# Patient Record
Sex: Female | Born: 1950
Health system: Southern US, Community
[De-identification: ages and names within clinical notes are randomized; demographics above are authoritative.]

## PROBLEM LIST (undated history)

## (undated) DIAGNOSIS — M8580 Other specified disorders of bone density and structure, unspecified site: Secondary | ICD-10-CM

## (undated) DIAGNOSIS — I10 Essential (primary) hypertension: Secondary | ICD-10-CM

## (undated) DIAGNOSIS — E785 Hyperlipidemia, unspecified: Secondary | ICD-10-CM

## (undated) DIAGNOSIS — M1990 Unspecified osteoarthritis, unspecified site: Secondary | ICD-10-CM

## (undated) DIAGNOSIS — T7840XA Allergy, unspecified, initial encounter: Secondary | ICD-10-CM

## (undated) DIAGNOSIS — D649 Anemia, unspecified: Secondary | ICD-10-CM

## (undated) HISTORY — DX: Other specified disorders of bone density and structure, unspecified site: M85.80

## (undated) HISTORY — DX: Anemia, unspecified: D64.9

## (undated) HISTORY — PX: POLYPECTOMY: SHX149

## (undated) HISTORY — DX: Allergy, unspecified, initial encounter: T78.40XA

## (undated) HISTORY — DX: Hyperlipidemia, unspecified: E78.5

## (undated) HISTORY — PX: ENDOMETRIAL ABLATION: SHX621

## (undated) HISTORY — PX: COLONOSCOPY: SHX174

## (undated) HISTORY — PX: OTHER SURGICAL HISTORY: SHX169

## (undated) HISTORY — PX: TONSILLECTOMY: SUR1361

## (undated) HISTORY — DX: Unspecified osteoarthritis, unspecified site: M19.90

---

## 2005-12-21 ENCOUNTER — Encounter: Admission: RE | Admit: 2005-12-21 | Discharge: 2005-12-21 | Payer: Self-pay | Admitting: Obstetrics & Gynecology

## 2011-12-26 ENCOUNTER — Other Ambulatory Visit: Payer: Self-pay | Admitting: Obstetrics and Gynecology

## 2011-12-26 ENCOUNTER — Other Ambulatory Visit: Payer: Self-pay | Admitting: Obstetrics & Gynecology

## 2011-12-26 DIAGNOSIS — Z780 Asymptomatic menopausal state: Secondary | ICD-10-CM

## 2011-12-26 DIAGNOSIS — Z1231 Encounter for screening mammogram for malignant neoplasm of breast: Secondary | ICD-10-CM

## 2012-01-12 ENCOUNTER — Ambulatory Visit
Admission: RE | Admit: 2012-01-12 | Discharge: 2012-01-12 | Disposition: A | Discharge status: Home / Self Care | Payer: BC Managed Care – PPO | Source: Ambulatory Visit | Attending: Obstetrics & Gynecology | Admitting: Obstetrics & Gynecology

## 2012-01-12 DIAGNOSIS — Z1231 Encounter for screening mammogram for malignant neoplasm of breast: Secondary | ICD-10-CM

## 2012-01-12 DIAGNOSIS — Z780 Asymptomatic menopausal state: Secondary | ICD-10-CM

## 2013-06-04 ENCOUNTER — Encounter (HOSPITAL_COMMUNITY): Payer: Self-pay | Admitting: Emergency Medicine

## 2013-06-04 ENCOUNTER — Emergency Department (HOSPITAL_COMMUNITY): Payer: BC Managed Care – PPO

## 2013-06-04 ENCOUNTER — Emergency Department (HOSPITAL_COMMUNITY)
Admission: EM | Admit: 2013-06-04 | Discharge: 2013-06-04 | Disposition: A | Discharge status: Home / Self Care | Payer: BC Managed Care – PPO | Attending: Emergency Medicine | Admitting: Emergency Medicine

## 2013-06-04 DIAGNOSIS — Z79899 Other long term (current) drug therapy: Secondary | ICD-10-CM | POA: Insufficient documentation

## 2013-06-04 DIAGNOSIS — K529 Noninfective gastroenteritis and colitis, unspecified: Secondary | ICD-10-CM

## 2013-06-04 DIAGNOSIS — Z7982 Long term (current) use of aspirin: Secondary | ICD-10-CM | POA: Insufficient documentation

## 2013-06-04 DIAGNOSIS — E876 Hypokalemia: Secondary | ICD-10-CM | POA: Insufficient documentation

## 2013-06-04 DIAGNOSIS — K921 Melena: Secondary | ICD-10-CM | POA: Insufficient documentation

## 2013-06-04 DIAGNOSIS — R197 Diarrhea, unspecified: Secondary | ICD-10-CM

## 2013-06-04 DIAGNOSIS — K5289 Other specified noninfective gastroenteritis and colitis: Secondary | ICD-10-CM | POA: Insufficient documentation

## 2013-06-04 DIAGNOSIS — I10 Essential (primary) hypertension: Secondary | ICD-10-CM | POA: Insufficient documentation

## 2013-06-04 DIAGNOSIS — R1110 Vomiting, unspecified: Secondary | ICD-10-CM

## 2013-06-04 HISTORY — DX: Essential (primary) hypertension: I10

## 2013-06-04 LAB — URINALYSIS, ROUTINE W REFLEX MICROSCOPIC
Bilirubin Urine: NEGATIVE
Glucose, UA: NEGATIVE mg/dL
Ketones, ur: NEGATIVE mg/dL
Leukocytes, UA: NEGATIVE
Nitrite: NEGATIVE
Protein, ur: NEGATIVE mg/dL
Specific Gravity, Urine: 1.016 (ref 1.005–1.030)
Urobilinogen, UA: 0.2 mg/dL (ref 0.0–1.0)
pH: 6.5 (ref 5.0–8.0)

## 2013-06-04 LAB — CBC WITH DIFFERENTIAL/PLATELET
Basophils Absolute: 0 10*3/uL (ref 0.0–0.1)
Basophils Relative: 0 % (ref 0–1)
Eosinophils Absolute: 0 10*3/uL (ref 0.0–0.7)
Eosinophils Relative: 0 % (ref 0–5)
HCT: 40.2 % (ref 36.0–46.0)
Hemoglobin: 14.1 g/dL (ref 12.0–15.0)
Lymphocytes Relative: 12 % (ref 12–46)
Lymphs Abs: 1.3 10*3/uL (ref 0.7–4.0)
MCH: 30.3 pg (ref 26.0–34.0)
MCHC: 35.1 g/dL (ref 30.0–36.0)
MCV: 86.5 fL (ref 78.0–100.0)
Monocytes Absolute: 0.4 10*3/uL (ref 0.1–1.0)
Monocytes Relative: 4 % (ref 3–12)
Neutro Abs: 8.8 10*3/uL — ABNORMAL HIGH (ref 1.7–7.7)
Neutrophils Relative %: 83 % — ABNORMAL HIGH (ref 43–77)
Platelets: 180 10*3/uL (ref 150–400)
RBC: 4.65 MIL/uL (ref 3.87–5.11)
RDW: 13.6 % (ref 11.5–15.5)
WBC: 10.5 10*3/uL (ref 4.0–10.5)

## 2013-06-04 LAB — COMPREHENSIVE METABOLIC PANEL
ALT: 14 U/L (ref 0–35)
Albumin: 4.2 g/dL (ref 3.5–5.2)
Alkaline Phosphatase: 72 U/L (ref 39–117)
BUN: 15 mg/dL (ref 6–23)
CO2: 23 mEq/L (ref 19–32)
Calcium: 9.6 mg/dL (ref 8.4–10.5)
Chloride: 98 mEq/L (ref 96–112)
Creatinine, Ser: 0.52 mg/dL (ref 0.50–1.10)
GFR calc non Af Amer: >90 mL/min (ref >90)
Glucose, Bld: 145 mg/dL — ABNORMAL HIGH (ref 70–99)
Sodium: 137 mEq/L (ref 135–145)
Total Bilirubin: 0.8 mg/dL (ref 0.3–1.2)
Total Protein: 7.1 g/dL (ref 6.0–8.3)

## 2013-06-04 LAB — URINE MICROSCOPIC-ADD ON

## 2013-06-04 LAB — OCCULT BLOOD, POC DEVICE: Fecal Occult Bld: POSITIVE — AB

## 2013-06-04 MED ORDER — IOHEXOL 300 MG/ML  SOLN
100.0000 mL | Freq: Once | INTRAMUSCULAR | Status: AC | PRN
  Administered 2013-06-04: 100 mL via INTRAVENOUS

## 2013-06-04 MED ORDER — CIPROFLOXACIN HCL 500 MG PO TABS
500.0000 mg | ORAL_TABLET | Freq: Once | ORAL | Status: AC
  Administered 2013-06-04: 500 mg via ORAL
  Filled 2013-06-04: qty 1

## 2013-06-04 MED ORDER — IOHEXOL 300 MG/ML  SOLN
25.0000 mL | INTRAMUSCULAR | Status: AC
  Administered 2013-06-04: 25 mL via ORAL

## 2013-06-04 MED ORDER — SODIUM CHLORIDE 0.9 % IV BOLUS (SEPSIS)
1000.0000 mL | Freq: Once | INTRAVENOUS | Status: AC
  Administered 2013-06-04: 1000 mL via INTRAVENOUS

## 2013-06-04 MED ORDER — POTASSIUM CHLORIDE CRYS ER 20 MEQ PO TBCR
40.0000 meq | EXTENDED_RELEASE_TABLET | Freq: Once | ORAL | Status: AC
  Administered 2013-06-04: 40 meq via ORAL
  Filled 2013-06-04: qty 2

## 2013-06-04 MED ORDER — METRONIDAZOLE 500 MG PO TABS
500.0000 mg | ORAL_TABLET | Freq: Once | ORAL | Status: AC
  Administered 2013-06-04: 500 mg via ORAL
  Filled 2013-06-04: qty 1

## 2013-06-04 MED ORDER — HYDROCODONE-ACETAMINOPHEN 5-325 MG PO TABS: 1.0000 | ORAL_TABLET | ORAL | Status: DC | PRN

## 2013-06-04 MED ORDER — MORPHINE SULFATE 4 MG/ML IJ SOLN
6.0000 mg | Freq: Once | INTRAMUSCULAR | Status: DC
  Filled 2013-06-04: qty 2

## 2013-06-04 MED ORDER — CIPROFLOXACIN HCL 500 MG PO TABS: 500.0000 mg | ORAL_TABLET | Freq: Two times a day (BID) | ORAL | Status: DC

## 2013-06-04 MED ORDER — METRONIDAZOLE 500 MG PO TABS: 500.0000 mg | ORAL_TABLET | Freq: Three times a day (TID) | ORAL | Status: DC

## 2013-06-04 MED ORDER — ONDANSETRON 4 MG PO TBDP: ORAL_TABLET | ORAL | Status: DC

## 2013-06-04 NOTE — ED Provider Notes (Signed)
CSN: [id number]     Arrival date & time 06/04/13  0022 History   First MD Initiated Contact with Patient 06/04/13 0211     Chief Complaint  Patient presents with  . Abdominal Pain  . Diarrhea  . Emesis  . Hematochezia   (Consider location/radiation/quality/duration/timing/severity/associated sxs/prior Treatment) HPI Comments: 62 yo female with htn hx, C section hx presents with LLQ pain and lighter blood in stools this evening, pain started this evening, no hx of similar, left lower region, severe cramps, intermittent, with diarrhea.  No diverticular hx.  Polyp removed on past colonscopy.  No ulcer hx.    Patient is a 62 y.o. female presenting with abdominal pain, diarrhea, and vomiting.  Abdominal Pain Associated symptoms: diarrhea and vomiting   Associated symptoms: no chest pain, no chills, no dysuria, no fever and no shortness of breath   Diarrhea Associated symptoms: abdominal pain and vomiting   Associated symptoms: no chills, no fever and no headaches   Emesis Associated symptoms: abdominal pain and diarrhea   Associated symptoms: no chills and no headaches     Past Medical History  Diagnosis Date  . Hypertension    Past Surgical History  Procedure Laterality Date  . Cesarean section     No family history on file. History  Substance Use Topics  . Smoking status: Never Smoker   . Smokeless tobacco: Not on file  . Alcohol Use: No   OB History   Grav Para Term Preterm Abortions TAB SAB Ect Mult Living                 Review of Systems  Constitutional: Negative for fever and chills.  HENT: Negative for congestion.   Eyes: Negative for visual disturbance.  Respiratory: Negative for shortness of breath.   Cardiovascular: Negative for chest pain.  Gastrointestinal: Positive for vomiting, abdominal pain, diarrhea and blood in stool.  Genitourinary: Negative for dysuria and flank pain.  Musculoskeletal: Negative for back pain, neck pain and neck stiffness.  Skin:  Negative for rash.  Neurological: Negative for light-headedness and headaches.    Allergies  Review of patient's allergies indicates no known allergies.  Home Medications   Current Outpatient Rx  Name  Route  Sig  Dispense  Refill  . amLODipine (NORVASC) 5 MG tablet   Oral   Take 5 mg by mouth daily.         Marland Kitchen aspirin EC 81 MG tablet   Oral   Take 81 mg by mouth daily.         . Calcium Carbonate-Vitamin D (CALCIUM-D) 600-400 MG-UNIT TABS   Oral   Take 1 tablet by mouth every morning.         . Cholecalciferol (VITAMIN D3) 2000 UNITS TABS   Oral   Take 1 tablet by mouth daily.         . Coenzyme Q10 (COQ10) 200 MG CAPS   Oral   Take 1 capsule by mouth every morning.         . Glucosamine-Chondroit-Vit C-Mn (GLUCOSAMINE 1500 COMPLEX PO)   Oral   Take 1 tablet by mouth every morning.         Boris Lown Oil 300 MG CAPS   Oral   Take 1 capsule by mouth daily.         . Multiple Vitamin (MULTIVITAMIN WITH MINERALS) TABS tablet   Oral   Take 1 tablet by mouth daily.  BP 160/74  Pulse 65  Temp(Src) 98.1 F (36.7 C) (Oral)  Resp 18  Wt 154 lb 9 oz (70.109 kg)  SpO2 94% Physical Exam  Nursing note and vitals reviewed. Constitutional: She is oriented to person, place, and time. She appears well-developed and well-nourished.  HENT:  Head: Normocephalic and atraumatic.  Eyes: Conjunctivae are normal. Right eye exhibits no discharge. Left eye exhibits no discharge.  Neck: Normal range of motion. Neck supple. No tracheal deviation present.  Cardiovascular: Normal rate and regular rhythm.   Pulmonary/Chest: Effort normal and breath sounds normal.  Abdominal: Soft. She exhibits no distension. There is tenderness (LLQ and suprapubic). There is no guarding.  Genitourinary:  Gross blood lighter red, no hemorrhoids seen  Musculoskeletal: She exhibits no edema.  Neurological: She is alert and oriented to person, place, and time.  Skin: Skin is warm. No  rash noted.  Psychiatric: She has a normal mood and affect.    ED Course  Procedures (including critical care time) Labs Review Labs Reviewed  CBC WITH DIFFERENTIAL - Abnormal; Notable for the following:    Neutrophils Relative % 83 (*)    Neutro Abs 8.8 (*)    All other components within normal limits  COMPREHENSIVE METABOLIC PANEL - Abnormal; Notable for the following:    Potassium 3.3 (*)    Glucose, Bld 145 (*)    All other components within normal limits  URINALYSIS, ROUTINE W REFLEX MICROSCOPIC - Abnormal; Notable for the following:    Hgb urine dipstick SMALL (*)    All other components within normal limits  URINE MICROSCOPIC-ADD ON - Abnormal; Notable for the following:    Squamous Epithelial / LPF FEW (*)    Bacteria, UA FEW (*)    Casts HYALINE CASTS (*)    All other components within normal limits  OCCULT BLOOD, POC DEVICE - Abnormal; Notable for the following:    Fecal Occult Bld POSITIVE (*)    All other components within normal limits  LIPASE, BLOOD   Imaging Review Ct Abdomen Pelvis W Contrast  06/04/2013   CLINICAL DATA:  Left lower quadrant abdominal pain, nausea, vomiting and diarrhea. Chills.  EXAM: CT ABDOMEN AND PELVIS WITH CONTRAST  TECHNIQUE: Multidetector CT imaging of the abdomen and pelvis was performed using the standard protocol following bolus administration of intravenous contrast.  CONTRAST:  OMNIPAQUE IOHEXOL 300 MG/ML  SOLN  COMPARISON:  None.  FINDINGS: The visualized lung bases are clear.  The liver and spleen are unremarkable in appearance. The gallbladder is within normal limits. The pancreas and adrenal glands are unremarkable.  The kidneys are unremarkable in appearance. There is no evidence of hydronephrosis. No renal or ureteral stones are seen. No perinephric stranding is appreciated.  No free fluid is identified. The small bowel is unremarkable in appearance. The stomach is within normal limits. No acute vascular abnormalities are seen.   The appendix is not definitely seen; there is no evidence for appendicitis. Mild scattered diverticulosis is noted at the hepatic flexure of the colon, and to a lesser extent along the transverse colon.  There appears to be mild diffuse wall thickening along the descending and proximal sigmoid colon, with mild associated soft tissue stranding. This raises concern for very mild colitis. Minimal diverticulosis is noted along the mid sigmoid colon.  The bladder is mildly distended and grossly unremarkable. The uterus is grossly normal in appearance, with scattered calcification likely reflecting a fibroid. No inguinal lymphadenopathy is seen.  No acute osseous abnormalities  are identified. Vacuum phenomenon and disc space narrowing are noted at L5-S1, with associated sclerotic change.  IMPRESSION: 1. Apparent mild acute colitis involving the descending and proximal sigmoid colon, with mild diffuse wall thickening and trace soft tissue inflammation. 2. Mild scattered diverticulosis at the hepatic flexure of the colon and minimally along the transverse and mid sigmoid colon.   Electronically Signed   By: Roanna Raider M.D.   On: 06/04/2013 04:53    EKG Interpretation   None       MDM   1. Colitis   2. Vomiting   3. Diarrhea   4. Blood in stool   HypoK  Concern for diverticulitis, no hx of similar. CT scan and blood work. Pain meds and fluids.   Pt refused pain meds. CT showed colitis.  PO abx in ED. Discussed diet/ abx and close fup oupt/ reasons to return.  Results and differential diagnosis were discussed with the patient. Close follow up outpatient was discussed, patient comfortable with the plan.   Diagnosis: above    Enid Skeens, MD 06/04/13 [phone]

## 2013-06-04 NOTE — ED Notes (Signed)
Pt. reports left lower abdominal pain with nausea , vomitting and diarrhea / chills. Pt. noted blood on her diarrhea.

## 2013-06-04 NOTE — ED Notes (Signed)
Pt c/o clots in her diarrhea starting this afternoon, states blood is red, pt states shes thrown up 4-5 times. LLQ abdominal pain with mid epigastric pain.

## 2013-06-27 ENCOUNTER — Encounter: Payer: Self-pay | Admitting: Internal Medicine

## 2013-07-23 ENCOUNTER — Encounter: Payer: Self-pay | Admitting: Internal Medicine

## 2013-07-24 ENCOUNTER — Encounter: Payer: Self-pay | Admitting: Internal Medicine

## 2013-07-24 ENCOUNTER — Other Ambulatory Visit: Payer: Self-pay | Admitting: Gastroenterology

## 2013-07-24 ENCOUNTER — Ambulatory Visit (INDEPENDENT_AMBULATORY_CARE_PROVIDER_SITE_OTHER): Payer: BC Managed Care – PPO | Admitting: Internal Medicine

## 2013-07-24 ENCOUNTER — Telehealth: Payer: Self-pay | Admitting: Gastroenterology

## 2013-07-24 VITALS — BP 140/72 | HR 68 | Wt 151.2 lb

## 2013-07-24 DIAGNOSIS — R109 Unspecified abdominal pain: Secondary | ICD-10-CM

## 2013-07-24 DIAGNOSIS — I10 Essential (primary) hypertension: Secondary | ICD-10-CM | POA: Insufficient documentation

## 2013-07-24 DIAGNOSIS — R9389 Abnormal findings on diagnostic imaging of other specified body structures: Secondary | ICD-10-CM

## 2013-07-24 DIAGNOSIS — K625 Hemorrhage of anus and rectum: Secondary | ICD-10-CM

## 2013-07-24 MED ORDER — MOVIPREP 100 G PO SOLR: ORAL | Status: DC

## 2013-07-24 MED ORDER — HYOSCYAMINE SULFATE 0.125 MG SL SUBL: 0.1250 mg | SUBLINGUAL_TABLET | SUBLINGUAL | Status: DC | PRN

## 2013-07-24 NOTE — Patient Instructions (Signed)
You have been scheduled for a colonoscopy with propofol. Please follow written instructions given to you at your visit today.  Please pick up your prep kit at the pharmacy within the next 1-3 days. If you use inhalers (even only as needed), please bring them with you on the day of your procedure. Your physician has requested that you go to www.[email or web address].com and enter the access code given to you at your visit today. This web site gives a general overview about your procedure. However, you should still follow specific instructions given to you by our office regarding your preparation for the procedure.                                                We are excited to introduce MyChart, a new best-in-class service that provides you online access to important information in your electronic medical record. We want to make it easier for you to view your health information - all in one secure location - when and where you need it. We expect MyChart will enhance the quality of care and service we provide.  When you register for MyChart, you can:    View your test results.    Request appointments and receive appointment reminders via email.    Request medication renewals.    View your medical history, allergies, medications and immunizations.    Communicate with your physician's office through a password-protected site.    Conveniently print information such as your medication lists.  To find out if MyChart is right for you, please talk to a member of our clinical staff today. We will gladly answer your questions about this free health and wellness tool.  If you are age 18 or older and want a member of your family to have access to your record, you must provide written consent by completing a proxy form available at our office. Please speak to our clinical staff about guidelines regarding accounts for patients younger than age 18.  As you activate your MyChart account and need any technical  assistance, please call the MyChart technical support line at [phone] ([phone]) or email your question to [email or web address]. If you email your question(s), please include your name, a return phone number and the best time to reach you.  If you have non-urgent health-related questions, you can send a message to our office through MyChart at [email or web address]. If you have a medical emergency, call 911.  Thank you for using MyChart as your new health and wellness resource!   MyChart licensed from [employer],  1999-2010. Patents Pending.   

## 2013-07-24 NOTE — Telephone Encounter (Signed)
Sent in levsin per Dr. Hilarie Fredrickson

## 2013-07-24 NOTE — Telephone Encounter (Signed)
Message copied by Annabell Sabal on Wed Jul 24, 2013  1:25 PM ------      Message from: Jerene Bears      Created: Wed Jul 24, 2013  1:07 PM       Levsin 0.25 mg SL every 4 hours when necessary left-sided abdominal discomfort ------

## 2013-07-24 NOTE — Progress Notes (Signed)
Patient ID: Karen Rangel, female   DOB: 06/17/1950, 63 y.o.   MRN: [id number] HPI: Mrs. Dorer is a 63-year-old female with a past medical history of hypertension, colonic polyps who is seen in consultation at the request of Dr. Rankins to evaluate left-sided abdominal pain, rectal bleeding and abnormal GI imaging. She is here today alone.  She reports in mid December developing 2-3 weeks of left-sided abdominal discomfort. She describes this as a burning type pain which was particularly more noticeable at night. It became acutely worse around 06/04/2013 when she developed waves of more severe pain associated with vomiting and diarrhea. The diarrhea became bloody and she was seen in the ER. CT scan revealed left-sided colitis and she was treated with 10 days of ciprofloxacin and metronidazole. She completed this antibiotic therapy and her rectal bleeding and pain have improved. She is still having some mild burning discomfort in her left abdomen which seems to come and go. She is having 2-3 bowel movements per day which are soft and formed. No further rectal bleeding. No melena. Prior to these episodes occurred normal bowel movements occurred every 2-3 days. She reports a good appetite without nausea or vomiting. No fever. No weight loss. No new medications. No joint symptoms, eye complaints, rashes or oral ulcers.  She's had 2 previous colonoscopies performed for screening. These were done in [place]. Her first one she recalls several benign polyps but her second and last colonoscopy was performed in 2010 which was reportedly normal. The family history of IBD or GI malignancy. Her mother had IBS.  Past Medical History  Diagnosis Date  . Hypertension     Past Surgical History  Procedure Laterality Date  . Cesarean section      Current Outpatient Prescriptions  Medication Sig Dispense Refill  . amLODipine (NORVASC) 5 MG tablet Take 5 mg by mouth daily.      . aspirin EC 81 MG tablet Take 81 mg by  mouth daily.      . Calcium Carbonate-Vitamin D (CALCIUM-D) 600-400 MG-UNIT TABS Take 1 tablet by mouth every morning.      . Cholecalciferol (VITAMIN D3) 2000 UNITS TABS Take 1 tablet by mouth daily.      . Coenzyme Q10 (COQ10) 200 MG CAPS Take 1 capsule by mouth every morning.      . Glucosamine-Chondroit-Vit C-Mn (GLUCOSAMINE 1500 COMPLEX PO) Take 1 tablet by mouth every morning.      . Krill Oil 300 MG CAPS Take 1 capsule by mouth daily.      . MOVIPREP 100 G SOLR Use per prep instruction  1 kit  0  . Multiple Vitamin (MULTIVITAMIN WITH MINERALS) TABS tablet Take 1 tablet by mouth daily.       No current facility-administered medications for this visit.    No Known Allergies  No family history on file.  History  Substance Use Topics  . Smoking status: Never Smoker   . Smokeless tobacco: Never Used  . Alcohol Use: No    ROS: As per history of present illness, otherwise negative  BP 140/72  Pulse 68  Wt 151 lb 3.2 oz (68.584 kg) Constitutional: Well-developed and well-nourished. No distress. HEENT: Normocephalic and atraumatic. Oropharynx is clear and moist. No oropharyngeal exudate. Conjunctivae are normal.  No scleral icterus. Neck: Neck supple. Trachea midline. Cardiovascular: Normal rate, regular rhythm and intact distal pulses. No M/R/G Pulmonary/chest: Effort normal and breath sounds normal. No wheezing, rales or rhonchi. Abdominal: Soft, mild left-sided tenderness, no   rebound or guarding, nondistended. Bowel sounds active throughout.  Extremities: no clubbing, cyanosis, or edema Lymphadenopathy: No cervical adenopathy noted. Neurological: Alert and oriented to person place and time. Skin: Skin is warm and dry. No rashes noted. Psychiatric: Normal mood and affect. Behavior is normal.  RELEVANT LABS AND IMAGING: CBC    Component Value Date/Time   WBC 10.5 06/04/2013 0045   RBC 4.65 06/04/2013 0045   HGB 14.1 06/04/2013 0045   HCT 40.2 06/04/2013 0045   PLT 180  06/04/2013 0045   MCV 86.5 06/04/2013 0045   MCH 30.3 06/04/2013 0045   MCHC 35.1 06/04/2013 0045   RDW 13.6 06/04/2013 0045   LYMPHSABS 1.3 06/04/2013 0045   MONOABS 0.4 06/04/2013 0045   EOSABS 0.0 06/04/2013 0045   BASOSABS 0.0 06/04/2013 0045    CMP     Component Value Date/Time   NA 137 06/04/2013 0045   K 3.3* 06/04/2013 0045   CL 98 06/04/2013 0045   CO2 23 06/04/2013 0045   GLUCOSE 145* 06/04/2013 0045   BUN 15 06/04/2013 0045   CREATININE 0.52 06/04/2013 0045   CALCIUM 9.6 06/04/2013 0045   PROT 7.1 06/04/2013 0045   ALBUMIN 4.2 06/04/2013 0045   AST 18 06/04/2013 0045   ALT 14 06/04/2013 0045   ALKPHOS 72 06/04/2013 0045   BILITOT 0.8 06/04/2013 0045   GFRNONAA >90 06/04/2013 0045   GFRAA >90 06/04/2013 0045   Lipase     Component Value Date/Time   LIPASE 19 06/04/2013 0045   CT ABDOMEN AND PELVIS WITH CONTRAST   TECHNIQUE: Multidetector CT imaging of the abdomen and pelvis was performed using the standard protocol following bolus administration of intravenous contrast.   CONTRAST:  100mL OMNIPAQUE IOHEXOL 300 MG/ML  SOLN   COMPARISON:  None.   FINDINGS: The visualized lung bases are clear.   The liver and spleen are unremarkable in appearance. The gallbladder is within normal limits. The pancreas and adrenal glands are unremarkable.   The kidneys are unremarkable in appearance. There is no evidence of hydronephrosis. No renal or ureteral stones are seen. No perinephric stranding is appreciated.   No free fluid is identified. The small bowel is unremarkable in appearance. The stomach is within normal limits. No acute vascular abnormalities are seen.   The appendix is not definitely seen; there is no evidence for appendicitis. Mild scattered diverticulosis is noted at the hepatic flexure of the colon, and to a lesser extent along the transverse colon.   There appears to be mild diffuse wall thickening along the descending and proximal  sigmoid colon, with mild associated soft tissue stranding. This raises concern for very mild colitis. Minimal diverticulosis is noted along the mid sigmoid colon.   The bladder is mildly distended and grossly unremarkable. The uterus is grossly normal in appearance, with scattered calcification likely reflecting a fibroid. No inguinal lymphadenopathy is seen.   No acute osseous abnormalities are identified. Vacuum phenomenon and disc space narrowing are noted at L5-S1, with associated sclerotic change.   IMPRESSION: 1. Apparent mild acute colitis involving the descending and proximal sigmoid colon, with mild diffuse wall thickening and trace soft tissue inflammation. 2. Mild scattered diverticulosis at the hepatic flexure of the colon and minimally along the transverse and mid sigmoid colon.    ASSESSMENT/PLAN: 62-year-old female with a past medical history of hypertension, colonic polyps who is seen in consultation at the request of Dr. Rankins to evaluate left-sided abdominal pain, rectal bleeding and abnormal GI imaging    1.  Abnl GI imaging/left-sided abd pain/rectal bleeding -- I have recommended repeating colonoscopy to evaluate for left-sided colitis. The differential includes inflammatory bowel disease, ischemic colitis and less likely infectious colitis. Her symptoms did not start abruptly which also argues against ischemic insult. Any infectious process should have been treated adequately with ciprofloxacin and metronidazole. I will give her prescription for Levsin to be used as needed and as directed for left-sided abdominal pain. The pain worsens I have asked that she notify me immediately. She voices understanding. Further recommendations can be made after colonoscopic findings.

## 2013-07-29 ENCOUNTER — Encounter: Payer: Self-pay | Admitting: Internal Medicine

## 2013-07-31 ENCOUNTER — Encounter: Payer: Self-pay | Admitting: Internal Medicine

## 2013-07-31 ENCOUNTER — Ambulatory Visit (AMBULATORY_SURGERY_CENTER): Payer: BC Managed Care – PPO | Admitting: Internal Medicine

## 2013-07-31 VITALS — BP 142/81 | HR 58 | Temp 96.9°F | Resp 20 | Ht 62.0 in | Wt 151.0 lb

## 2013-07-31 DIAGNOSIS — R933 Abnormal findings on diagnostic imaging of other parts of digestive tract: Secondary | ICD-10-CM

## 2013-07-31 DIAGNOSIS — R109 Unspecified abdominal pain: Secondary | ICD-10-CM

## 2013-07-31 DIAGNOSIS — D126 Benign neoplasm of colon, unspecified: Secondary | ICD-10-CM

## 2013-07-31 MED ORDER — SODIUM CHLORIDE 0.9 % IV SOLN: 500.0000 mL | INTRAVENOUS | Status: DC

## 2013-07-31 NOTE — Op Note (Signed)
[facility]  [address] [place], [id number]   COLONOSCOPY PROCEDURE REPORT  PATIENT: Karen Rangel, Karen Rangel  MR#: [id number] BIRTHDATE: 1950/11/22 , 58  yrs. old GENDER: Female ENDOSCOPIST: Jerene Bears, MD REFERRED QI:[id number] Rankins, M.D. PROCEDURE DATE:  07/31/2013 PROCEDURE:   Colonoscopy with cold biopsy polypectomy First Screening Colonoscopy - Avg.  risk and is 50 yrs.  old or older - No.  Prior Negative Screening - Now for repeat screening. N/A  History of Adenoma - Now for follow-up colonoscopy & has been > or = to 3 yrs.  N/A  Polyps Removed Today? Yes. ASA CLASS:   Class II INDICATIONS:abdominal pain in the lower left quadrant and an abnormal CT. MEDICATIONS: MAC sedation, administered by CRNA and Propofol (Diprivan) 370 mg IV  DESCRIPTION OF PROCEDURE:   After the risks benefits and alternatives of the procedure were thoroughly explained, informed consent was obtained.  A digital rectal exam revealed no rectal mass.   The LB [id number]  endoscope was introduced through the anus and advanced to the terminal ileum which was intubated for a short distance. No adverse events experienced.   The quality of the prep was good, using MoviPrep  The instrument was then slowly withdrawn as the colon was fully examined.   COLON FINDINGS: The mucosa appeared normal in the terminal ileum. Mild diverticulosis was noted in the ascending colon, at the hepatic flexure, and sigmoid colon.   The colon mucosa was otherwise normal.  No inflammation was seen.  Retroflexed views revealed skin tag and no other  abnormalities. The time to cecum=7 minutes 33 seconds.  Withdrawal time=11 minutes 50 seconds.  The scope was withdrawn and the procedure completed. COMPLICATIONS: There were no complications.  ENDOSCOPIC IMPRESSION: 1.   Normal mucosa in the terminal ileum 2.   Mild diverticulosis was noted in the ascending colon and at the hepatic flexure 3.   The colon mucosa  was otherwise normal without evidence of colitis/inflammation  RECOMMENDATIONS: 1.  Await pathology results 2.  High fiber diet 3.  If the polyp removed today is proven to be an adenomatous (pre-cancerous) polyp, you will need a repeat colonoscopy in 5 years.  Otherwise you should continue to follow colorectal cancer screening guidelines for "routine risk" patients with colonoscopy in 10 years.  You will receive a letter within 1-2 weeks with the results of your biopsy as well as final recommendations.  Please call my office if you have not received a letter after 3 weeks. 4.   One month of probiotic   eSigned:  Jerene Bears, MD 07/31/2013 10:11 AM    cc: The Patient and Milagros Evener, MD

## 2013-07-31 NOTE — Progress Notes (Signed)
Report to pacu rn, vss, bbs=clear 

## 2013-07-31 NOTE — Progress Notes (Signed)
Called to room to assist during endoscopic procedure.  Patient ID and intended procedure confirmed with present staff. Received instructions for my participation in the procedure from the performing physician.  

## 2013-07-31 NOTE — Progress Notes (Signed)
Patient denies any allergies to eggs or soy. Patient denies any problems with anesthesia.  

## 2013-07-31 NOTE — Patient Instructions (Signed)
Discharge instructions given with verbal understanding. Handouts on polyps and diverticulosis. Resume previous medications. YOU HAD AN ENDOSCOPIC PROCEDURE TODAY AT THE [facility]: Refer to the procedure report that was given to you for any specific questions about what was found during the examination.  If the procedure report does not answer your questions, please call your gastroenterologist to clarify.  If you requested that your care partner not be given the details of your procedure findings, then the procedure report has been included in a sealed envelope for you to review at your convenience later.  YOU SHOULD EXPECT: Some feelings of bloating in the abdomen. Passage of more gas than usual.  Walking can help get rid of the air that was put into your GI tract during the procedure and reduce the bloating. If you had a lower endoscopy (such as a colonoscopy or flexible sigmoidoscopy) you may notice spotting of blood in your stool or on the toilet paper. If you underwent a bowel prep for your procedure, then you may not have a normal bowel movement for a few days.  DIET: Your first meal following the procedure should be a light meal and then it is ok to progress to your normal diet.  A half-sandwich or bowl of soup is an example of a good first meal.  Heavy or fried foods are harder to digest and may make you feel nauseous or bloated.  Likewise meals heavy in dairy and vegetables can cause extra gas to form and this can also increase the bloating.  Drink plenty of fluids but you should avoid alcoholic beverages for 24 hours.  ACTIVITY: Your care partner should take you home directly after the procedure.  You should plan to take it easy, moving slowly for the rest of the day.  You can resume normal activity the day after the procedure however you should NOT DRIVE or use heavy machinery for 24 hours (because of the sedation medicines used during the test).    SYMPTOMS TO REPORT  IMMEDIATELY: A gastroenterologist can be reached at any hour.  During normal business hours, 8:30 AM to 5:00 PM Monday through Friday, call [phone].  After hours and on weekends, please call the GI answering service at [phone] who will take a message and have the physician on call contact you.   Following lower endoscopy (colonoscopy or flexible sigmoidoscopy):  Excessive amounts of blood in the stool  Significant tenderness or worsening of abdominal pains  Swelling of the abdomen that is new, acute  Fever of 100F or higher  FOLLOW UP: If any biopsies were taken you will be contacted by phone or by letter within the next 1-3 weeks.  Call your gastroenterologist if you have not heard about the biopsies in 3 weeks.  Our staff will call the home number listed on your records the next business day following your procedure to check on you and address any questions or concerns that you may have at that time regarding the information given to you following your procedure. This is a courtesy call and so if there is no answer at the home number and we have not heard from you through the emergency physician on call, we will assume that you have returned to your regular daily activities without incident.  SIGNATURES/CONFIDENTIALITY: You and/or your care partner have signed paperwork which will be entered into your electronic medical record.  These signatures attest to the fact that that the information above on your After Visit Summary   has been reviewed and is understood.  Full responsibility of the confidentiality of this discharge information lies with you and/or your care-partner. 

## 2013-08-01 ENCOUNTER — Telehealth: Payer: Self-pay

## 2013-08-01 NOTE — Telephone Encounter (Signed)
  Follow up Call-  Call back number 07/31/2013  Post procedure Call Back phone  # [phone]  Permission to leave phone message Yes     Patient questions:  Do you have a fever, pain , or abdominal swelling? no Pain Score  0 *  Have you tolerated food without any problems? yes  Have you been able to return to your normal activities? yes  Do you have any questions about your discharge instructions: Diet   no Medications  no Follow up visit  no  Do you have questions or concerns about your Care? no  Actions: * If pain score is 4 or above: No action needed, pain <4.

## 2013-08-07 ENCOUNTER — Encounter: Payer: Self-pay | Admitting: Internal Medicine

## 2014-04-02 ENCOUNTER — Other Ambulatory Visit: Payer: Self-pay | Admitting: Family Medicine

## 2014-04-02 DIAGNOSIS — T380X5A Adverse effect of glucocorticoids and synthetic analogues, initial encounter: Principal | ICD-10-CM

## 2014-04-02 DIAGNOSIS — Z1239 Encounter for other screening for malignant neoplasm of breast: Secondary | ICD-10-CM

## 2014-04-02 DIAGNOSIS — M8580 Other specified disorders of bone density and structure, unspecified site: Secondary | ICD-10-CM

## 2014-04-25 ENCOUNTER — Ambulatory Visit
Admission: RE | Admit: 2014-04-25 | Discharge: 2014-04-25 | Disposition: A | Discharge status: Home / Self Care | Payer: BC Managed Care – PPO | Source: Ambulatory Visit | Attending: Family Medicine | Admitting: Family Medicine

## 2014-04-25 DIAGNOSIS — Z1239 Encounter for other screening for malignant neoplasm of breast: Secondary | ICD-10-CM

## 2014-04-25 DIAGNOSIS — T380X5A Adverse effect of glucocorticoids and synthetic analogues, initial encounter: Principal | ICD-10-CM

## 2014-04-25 DIAGNOSIS — M8580 Other specified disorders of bone density and structure, unspecified site: Secondary | ICD-10-CM

## 2015-06-17 DIAGNOSIS — E785 Hyperlipidemia, unspecified: Secondary | ICD-10-CM | POA: Diagnosis not present

## 2015-06-17 DIAGNOSIS — I10 Essential (primary) hypertension: Secondary | ICD-10-CM | POA: Diagnosis not present

## 2015-06-18 DIAGNOSIS — Z0000 Encounter for general adult medical examination without abnormal findings: Secondary | ICD-10-CM | POA: Diagnosis not present

## 2015-06-18 DIAGNOSIS — M9901 Segmental and somatic dysfunction of cervical region: Secondary | ICD-10-CM | POA: Diagnosis not present

## 2015-06-18 DIAGNOSIS — I10 Essential (primary) hypertension: Secondary | ICD-10-CM | POA: Diagnosis not present

## 2015-06-18 DIAGNOSIS — M9903 Segmental and somatic dysfunction of lumbar region: Secondary | ICD-10-CM | POA: Diagnosis not present

## 2015-06-18 DIAGNOSIS — M5032 Other cervical disc degeneration, mid-cervical region, unspecified level: Secondary | ICD-10-CM | POA: Diagnosis not present

## 2015-06-18 DIAGNOSIS — M5137 Other intervertebral disc degeneration, lumbosacral region: Secondary | ICD-10-CM | POA: Diagnosis not present

## 2015-06-18 DIAGNOSIS — M9902 Segmental and somatic dysfunction of thoracic region: Secondary | ICD-10-CM | POA: Diagnosis not present

## 2015-06-18 DIAGNOSIS — M5414 Radiculopathy, thoracic region: Secondary | ICD-10-CM | POA: Diagnosis not present

## 2015-06-19 DIAGNOSIS — H524 Presbyopia: Secondary | ICD-10-CM | POA: Diagnosis not present

## 2015-07-10 DIAGNOSIS — Z23 Encounter for immunization: Secondary | ICD-10-CM | POA: Diagnosis not present

## 2015-07-16 DIAGNOSIS — M5414 Radiculopathy, thoracic region: Secondary | ICD-10-CM | POA: Diagnosis not present

## 2015-07-16 DIAGNOSIS — M9903 Segmental and somatic dysfunction of lumbar region: Secondary | ICD-10-CM | POA: Diagnosis not present

## 2015-07-16 DIAGNOSIS — M5032 Other cervical disc degeneration, mid-cervical region, unspecified level: Secondary | ICD-10-CM | POA: Diagnosis not present

## 2015-07-16 DIAGNOSIS — M9902 Segmental and somatic dysfunction of thoracic region: Secondary | ICD-10-CM | POA: Diagnosis not present

## 2015-07-16 DIAGNOSIS — M5137 Other intervertebral disc degeneration, lumbosacral region: Secondary | ICD-10-CM | POA: Diagnosis not present

## 2015-07-16 DIAGNOSIS — R69 Illness, unspecified: Secondary | ICD-10-CM | POA: Diagnosis not present

## 2015-07-16 DIAGNOSIS — M9901 Segmental and somatic dysfunction of cervical region: Secondary | ICD-10-CM | POA: Diagnosis not present

## 2015-09-11 DIAGNOSIS — M5414 Radiculopathy, thoracic region: Secondary | ICD-10-CM | POA: Diagnosis not present

## 2015-09-11 DIAGNOSIS — M9902 Segmental and somatic dysfunction of thoracic region: Secondary | ICD-10-CM | POA: Diagnosis not present

## 2015-09-11 DIAGNOSIS — M9903 Segmental and somatic dysfunction of lumbar region: Secondary | ICD-10-CM | POA: Diagnosis not present

## 2015-09-11 DIAGNOSIS — M5137 Other intervertebral disc degeneration, lumbosacral region: Secondary | ICD-10-CM | POA: Diagnosis not present

## 2015-09-11 DIAGNOSIS — M5032 Other cervical disc degeneration, mid-cervical region, unspecified level: Secondary | ICD-10-CM | POA: Diagnosis not present

## 2015-09-11 DIAGNOSIS — M9901 Segmental and somatic dysfunction of cervical region: Secondary | ICD-10-CM | POA: Diagnosis not present

## 2015-10-08 DIAGNOSIS — M5414 Radiculopathy, thoracic region: Secondary | ICD-10-CM | POA: Diagnosis not present

## 2015-10-08 DIAGNOSIS — M9902 Segmental and somatic dysfunction of thoracic region: Secondary | ICD-10-CM | POA: Diagnosis not present

## 2015-10-08 DIAGNOSIS — M5032 Other cervical disc degeneration, mid-cervical region, unspecified level: Secondary | ICD-10-CM | POA: Diagnosis not present

## 2015-10-08 DIAGNOSIS — M5137 Other intervertebral disc degeneration, lumbosacral region: Secondary | ICD-10-CM | POA: Diagnosis not present

## 2015-10-08 DIAGNOSIS — M9903 Segmental and somatic dysfunction of lumbar region: Secondary | ICD-10-CM | POA: Diagnosis not present

## 2015-10-08 DIAGNOSIS — M9901 Segmental and somatic dysfunction of cervical region: Secondary | ICD-10-CM | POA: Diagnosis not present

## 2015-11-12 DIAGNOSIS — M5414 Radiculopathy, thoracic region: Secondary | ICD-10-CM | POA: Diagnosis not present

## 2015-11-12 DIAGNOSIS — M9902 Segmental and somatic dysfunction of thoracic region: Secondary | ICD-10-CM | POA: Diagnosis not present

## 2015-11-12 DIAGNOSIS — M5032 Other cervical disc degeneration, mid-cervical region, unspecified level: Secondary | ICD-10-CM | POA: Diagnosis not present

## 2015-11-12 DIAGNOSIS — M9903 Segmental and somatic dysfunction of lumbar region: Secondary | ICD-10-CM | POA: Diagnosis not present

## 2015-11-12 DIAGNOSIS — M5137 Other intervertebral disc degeneration, lumbosacral region: Secondary | ICD-10-CM | POA: Diagnosis not present

## 2015-11-12 DIAGNOSIS — M9901 Segmental and somatic dysfunction of cervical region: Secondary | ICD-10-CM | POA: Diagnosis not present

## 2016-03-03 DIAGNOSIS — M5032 Other cervical disc degeneration, mid-cervical region, unspecified level: Secondary | ICD-10-CM | POA: Diagnosis not present

## 2016-03-03 DIAGNOSIS — M9903 Segmental and somatic dysfunction of lumbar region: Secondary | ICD-10-CM | POA: Diagnosis not present

## 2016-03-03 DIAGNOSIS — M5414 Radiculopathy, thoracic region: Secondary | ICD-10-CM | POA: Diagnosis not present

## 2016-03-03 DIAGNOSIS — M9901 Segmental and somatic dysfunction of cervical region: Secondary | ICD-10-CM | POA: Diagnosis not present

## 2016-03-03 DIAGNOSIS — M5386 Other specified dorsopathies, lumbar region: Secondary | ICD-10-CM | POA: Diagnosis not present

## 2016-03-03 DIAGNOSIS — M9902 Segmental and somatic dysfunction of thoracic region: Secondary | ICD-10-CM | POA: Diagnosis not present

## 2016-03-07 DIAGNOSIS — M5386 Other specified dorsopathies, lumbar region: Secondary | ICD-10-CM | POA: Diagnosis not present

## 2016-03-07 DIAGNOSIS — M9902 Segmental and somatic dysfunction of thoracic region: Secondary | ICD-10-CM | POA: Diagnosis not present

## 2016-03-07 DIAGNOSIS — M9901 Segmental and somatic dysfunction of cervical region: Secondary | ICD-10-CM | POA: Diagnosis not present

## 2016-03-07 DIAGNOSIS — M9903 Segmental and somatic dysfunction of lumbar region: Secondary | ICD-10-CM | POA: Diagnosis not present

## 2016-03-07 DIAGNOSIS — M5032 Other cervical disc degeneration, mid-cervical region, unspecified level: Secondary | ICD-10-CM | POA: Diagnosis not present

## 2016-03-07 DIAGNOSIS — M5414 Radiculopathy, thoracic region: Secondary | ICD-10-CM | POA: Diagnosis not present

## 2016-03-14 DIAGNOSIS — M5414 Radiculopathy, thoracic region: Secondary | ICD-10-CM | POA: Diagnosis not present

## 2016-03-14 DIAGNOSIS — M9902 Segmental and somatic dysfunction of thoracic region: Secondary | ICD-10-CM | POA: Diagnosis not present

## 2016-03-14 DIAGNOSIS — M5386 Other specified dorsopathies, lumbar region: Secondary | ICD-10-CM | POA: Diagnosis not present

## 2016-03-14 DIAGNOSIS — M9903 Segmental and somatic dysfunction of lumbar region: Secondary | ICD-10-CM | POA: Diagnosis not present

## 2016-03-14 DIAGNOSIS — M5032 Other cervical disc degeneration, mid-cervical region, unspecified level: Secondary | ICD-10-CM | POA: Diagnosis not present

## 2016-03-14 DIAGNOSIS — M9901 Segmental and somatic dysfunction of cervical region: Secondary | ICD-10-CM | POA: Diagnosis not present

## 2016-03-21 DIAGNOSIS — M5386 Other specified dorsopathies, lumbar region: Secondary | ICD-10-CM | POA: Diagnosis not present

## 2016-03-21 DIAGNOSIS — M9903 Segmental and somatic dysfunction of lumbar region: Secondary | ICD-10-CM | POA: Diagnosis not present

## 2016-03-21 DIAGNOSIS — M5032 Other cervical disc degeneration, mid-cervical region, unspecified level: Secondary | ICD-10-CM | POA: Diagnosis not present

## 2016-03-21 DIAGNOSIS — M5414 Radiculopathy, thoracic region: Secondary | ICD-10-CM | POA: Diagnosis not present

## 2016-03-21 DIAGNOSIS — M9901 Segmental and somatic dysfunction of cervical region: Secondary | ICD-10-CM | POA: Diagnosis not present

## 2016-03-21 DIAGNOSIS — M9902 Segmental and somatic dysfunction of thoracic region: Secondary | ICD-10-CM | POA: Diagnosis not present

## 2016-04-04 DIAGNOSIS — M9902 Segmental and somatic dysfunction of thoracic region: Secondary | ICD-10-CM | POA: Diagnosis not present

## 2016-04-04 DIAGNOSIS — M9903 Segmental and somatic dysfunction of lumbar region: Secondary | ICD-10-CM | POA: Diagnosis not present

## 2016-04-04 DIAGNOSIS — M5414 Radiculopathy, thoracic region: Secondary | ICD-10-CM | POA: Diagnosis not present

## 2016-04-04 DIAGNOSIS — M9901 Segmental and somatic dysfunction of cervical region: Secondary | ICD-10-CM | POA: Diagnosis not present

## 2016-04-04 DIAGNOSIS — M5032 Other cervical disc degeneration, mid-cervical region, unspecified level: Secondary | ICD-10-CM | POA: Diagnosis not present

## 2016-04-04 DIAGNOSIS — M5386 Other specified dorsopathies, lumbar region: Secondary | ICD-10-CM | POA: Diagnosis not present

## 2016-04-13 DIAGNOSIS — M5386 Other specified dorsopathies, lumbar region: Secondary | ICD-10-CM | POA: Diagnosis not present

## 2016-04-13 DIAGNOSIS — M5414 Radiculopathy, thoracic region: Secondary | ICD-10-CM | POA: Diagnosis not present

## 2016-04-13 DIAGNOSIS — M9903 Segmental and somatic dysfunction of lumbar region: Secondary | ICD-10-CM | POA: Diagnosis not present

## 2016-04-13 DIAGNOSIS — M9901 Segmental and somatic dysfunction of cervical region: Secondary | ICD-10-CM | POA: Diagnosis not present

## 2016-04-13 DIAGNOSIS — M5032 Other cervical disc degeneration, mid-cervical region, unspecified level: Secondary | ICD-10-CM | POA: Diagnosis not present

## 2016-04-13 DIAGNOSIS — M9902 Segmental and somatic dysfunction of thoracic region: Secondary | ICD-10-CM | POA: Diagnosis not present

## 2016-07-21 DIAGNOSIS — M9902 Segmental and somatic dysfunction of thoracic region: Secondary | ICD-10-CM | POA: Diagnosis not present

## 2016-07-21 DIAGNOSIS — M9903 Segmental and somatic dysfunction of lumbar region: Secondary | ICD-10-CM | POA: Diagnosis not present

## 2016-07-21 DIAGNOSIS — M5414 Radiculopathy, thoracic region: Secondary | ICD-10-CM | POA: Diagnosis not present

## 2016-07-21 DIAGNOSIS — M5032 Other cervical disc degeneration, mid-cervical region, unspecified level: Secondary | ICD-10-CM | POA: Diagnosis not present

## 2016-07-21 DIAGNOSIS — M9901 Segmental and somatic dysfunction of cervical region: Secondary | ICD-10-CM | POA: Diagnosis not present

## 2016-07-21 DIAGNOSIS — M5417 Radiculopathy, lumbosacral region: Secondary | ICD-10-CM | POA: Diagnosis not present

## 2016-07-25 DIAGNOSIS — M9902 Segmental and somatic dysfunction of thoracic region: Secondary | ICD-10-CM | POA: Diagnosis not present

## 2016-07-25 DIAGNOSIS — M5032 Other cervical disc degeneration, mid-cervical region, unspecified level: Secondary | ICD-10-CM | POA: Diagnosis not present

## 2016-07-25 DIAGNOSIS — M5417 Radiculopathy, lumbosacral region: Secondary | ICD-10-CM | POA: Diagnosis not present

## 2016-07-25 DIAGNOSIS — M9903 Segmental and somatic dysfunction of lumbar region: Secondary | ICD-10-CM | POA: Diagnosis not present

## 2016-07-25 DIAGNOSIS — M9901 Segmental and somatic dysfunction of cervical region: Secondary | ICD-10-CM | POA: Diagnosis not present

## 2016-07-25 DIAGNOSIS — M5414 Radiculopathy, thoracic region: Secondary | ICD-10-CM | POA: Diagnosis not present

## 2016-07-25 DIAGNOSIS — R69 Illness, unspecified: Secondary | ICD-10-CM | POA: Diagnosis not present

## 2016-07-27 DIAGNOSIS — M5032 Other cervical disc degeneration, mid-cervical region, unspecified level: Secondary | ICD-10-CM | POA: Diagnosis not present

## 2016-07-27 DIAGNOSIS — M9903 Segmental and somatic dysfunction of lumbar region: Secondary | ICD-10-CM | POA: Diagnosis not present

## 2016-07-27 DIAGNOSIS — M9902 Segmental and somatic dysfunction of thoracic region: Secondary | ICD-10-CM | POA: Diagnosis not present

## 2016-07-27 DIAGNOSIS — M5414 Radiculopathy, thoracic region: Secondary | ICD-10-CM | POA: Diagnosis not present

## 2016-07-27 DIAGNOSIS — M5417 Radiculopathy, lumbosacral region: Secondary | ICD-10-CM | POA: Diagnosis not present

## 2016-07-27 DIAGNOSIS — M9901 Segmental and somatic dysfunction of cervical region: Secondary | ICD-10-CM | POA: Diagnosis not present

## 2016-08-15 DIAGNOSIS — M9902 Segmental and somatic dysfunction of thoracic region: Secondary | ICD-10-CM | POA: Diagnosis not present

## 2016-08-15 DIAGNOSIS — M5417 Radiculopathy, lumbosacral region: Secondary | ICD-10-CM | POA: Diagnosis not present

## 2016-08-15 DIAGNOSIS — M9903 Segmental and somatic dysfunction of lumbar region: Secondary | ICD-10-CM | POA: Diagnosis not present

## 2016-08-15 DIAGNOSIS — M5414 Radiculopathy, thoracic region: Secondary | ICD-10-CM | POA: Diagnosis not present

## 2016-08-15 DIAGNOSIS — M5032 Other cervical disc degeneration, mid-cervical region, unspecified level: Secondary | ICD-10-CM | POA: Diagnosis not present

## 2016-08-15 DIAGNOSIS — M9901 Segmental and somatic dysfunction of cervical region: Secondary | ICD-10-CM | POA: Diagnosis not present

## 2016-08-29 DIAGNOSIS — M9902 Segmental and somatic dysfunction of thoracic region: Secondary | ICD-10-CM | POA: Diagnosis not present

## 2016-08-29 DIAGNOSIS — M5414 Radiculopathy, thoracic region: Secondary | ICD-10-CM | POA: Diagnosis not present

## 2016-08-29 DIAGNOSIS — M9903 Segmental and somatic dysfunction of lumbar region: Secondary | ICD-10-CM | POA: Diagnosis not present

## 2016-08-29 DIAGNOSIS — M5417 Radiculopathy, lumbosacral region: Secondary | ICD-10-CM | POA: Diagnosis not present

## 2016-08-29 DIAGNOSIS — M5032 Other cervical disc degeneration, mid-cervical region, unspecified level: Secondary | ICD-10-CM | POA: Diagnosis not present

## 2016-08-29 DIAGNOSIS — M9901 Segmental and somatic dysfunction of cervical region: Secondary | ICD-10-CM | POA: Diagnosis not present

## 2016-09-01 DIAGNOSIS — R040 Epistaxis: Secondary | ICD-10-CM | POA: Diagnosis not present

## 2016-09-01 DIAGNOSIS — I10 Essential (primary) hypertension: Secondary | ICD-10-CM | POA: Diagnosis not present

## 2016-09-29 DIAGNOSIS — M9901 Segmental and somatic dysfunction of cervical region: Secondary | ICD-10-CM | POA: Diagnosis not present

## 2016-09-29 DIAGNOSIS — M5417 Radiculopathy, lumbosacral region: Secondary | ICD-10-CM | POA: Diagnosis not present

## 2016-09-29 DIAGNOSIS — M5032 Other cervical disc degeneration, mid-cervical region, unspecified level: Secondary | ICD-10-CM | POA: Diagnosis not present

## 2016-09-29 DIAGNOSIS — M9902 Segmental and somatic dysfunction of thoracic region: Secondary | ICD-10-CM | POA: Diagnosis not present

## 2016-09-29 DIAGNOSIS — M5414 Radiculopathy, thoracic region: Secondary | ICD-10-CM | POA: Diagnosis not present

## 2016-09-29 DIAGNOSIS — M9903 Segmental and somatic dysfunction of lumbar region: Secondary | ICD-10-CM | POA: Diagnosis not present

## 2016-09-30 ENCOUNTER — Other Ambulatory Visit (HOSPITAL_COMMUNITY)
Admission: RE | Admit: 2016-09-30 | Discharge: 2016-09-30 | Disposition: A | Discharge status: Home / Self Care | Payer: Medicare HMO | Source: Ambulatory Visit | Attending: Family Medicine | Admitting: Family Medicine

## 2016-09-30 ENCOUNTER — Other Ambulatory Visit: Payer: Self-pay | Admitting: Family Medicine

## 2016-09-30 DIAGNOSIS — Z124 Encounter for screening for malignant neoplasm of cervix: Secondary | ICD-10-CM | POA: Diagnosis not present

## 2016-09-30 DIAGNOSIS — E7800 Pure hypercholesterolemia, unspecified: Secondary | ICD-10-CM | POA: Diagnosis not present

## 2016-09-30 DIAGNOSIS — I10 Essential (primary) hypertension: Secondary | ICD-10-CM | POA: Diagnosis not present

## 2016-09-30 DIAGNOSIS — Z0000 Encounter for general adult medical examination without abnormal findings: Secondary | ICD-10-CM | POA: Diagnosis not present

## 2016-09-30 DIAGNOSIS — Z23 Encounter for immunization: Secondary | ICD-10-CM | POA: Diagnosis not present

## 2016-09-30 DIAGNOSIS — Z1159 Encounter for screening for other viral diseases: Secondary | ICD-10-CM | POA: Diagnosis not present

## 2016-10-03 LAB — CYTOLOGY - PAP: Diagnosis: NEGATIVE

## 2016-10-05 ENCOUNTER — Other Ambulatory Visit: Payer: Self-pay | Admitting: Family Medicine

## 2016-10-05 DIAGNOSIS — Z1231 Encounter for screening mammogram for malignant neoplasm of breast: Secondary | ICD-10-CM

## 2016-10-17 DIAGNOSIS — M5414 Radiculopathy, thoracic region: Secondary | ICD-10-CM | POA: Diagnosis not present

## 2016-10-17 DIAGNOSIS — M5032 Other cervical disc degeneration, mid-cervical region, unspecified level: Secondary | ICD-10-CM | POA: Diagnosis not present

## 2016-10-17 DIAGNOSIS — M9902 Segmental and somatic dysfunction of thoracic region: Secondary | ICD-10-CM | POA: Diagnosis not present

## 2016-10-17 DIAGNOSIS — M9901 Segmental and somatic dysfunction of cervical region: Secondary | ICD-10-CM | POA: Diagnosis not present

## 2016-10-17 DIAGNOSIS — M5417 Radiculopathy, lumbosacral region: Secondary | ICD-10-CM | POA: Diagnosis not present

## 2016-10-17 DIAGNOSIS — M9903 Segmental and somatic dysfunction of lumbar region: Secondary | ICD-10-CM | POA: Diagnosis not present

## 2016-10-21 ENCOUNTER — Other Ambulatory Visit: Payer: Self-pay | Admitting: Family Medicine

## 2016-10-21 DIAGNOSIS — M8588 Other specified disorders of bone density and structure, other site: Secondary | ICD-10-CM

## 2016-11-11 ENCOUNTER — Ambulatory Visit
Admission: RE | Admit: 2016-11-11 | Discharge: 2016-11-11 | Disposition: A | Discharge status: Home / Self Care | Payer: Medicare HMO | Source: Ambulatory Visit | Attending: Family Medicine | Admitting: Family Medicine

## 2016-11-11 DIAGNOSIS — Z1231 Encounter for screening mammogram for malignant neoplasm of breast: Secondary | ICD-10-CM

## 2016-11-11 DIAGNOSIS — M8589 Other specified disorders of bone density and structure, multiple sites: Secondary | ICD-10-CM | POA: Diagnosis not present

## 2016-11-11 DIAGNOSIS — M8588 Other specified disorders of bone density and structure, other site: Secondary | ICD-10-CM

## 2016-11-14 DIAGNOSIS — M9903 Segmental and somatic dysfunction of lumbar region: Secondary | ICD-10-CM | POA: Diagnosis not present

## 2016-11-14 DIAGNOSIS — M9902 Segmental and somatic dysfunction of thoracic region: Secondary | ICD-10-CM | POA: Diagnosis not present

## 2016-11-14 DIAGNOSIS — M5032 Other cervical disc degeneration, mid-cervical region, unspecified level: Secondary | ICD-10-CM | POA: Diagnosis not present

## 2016-11-14 DIAGNOSIS — M9901 Segmental and somatic dysfunction of cervical region: Secondary | ICD-10-CM | POA: Diagnosis not present

## 2016-11-14 DIAGNOSIS — M5417 Radiculopathy, lumbosacral region: Secondary | ICD-10-CM | POA: Diagnosis not present

## 2016-11-14 DIAGNOSIS — M5414 Radiculopathy, thoracic region: Secondary | ICD-10-CM | POA: Diagnosis not present

## 2016-11-28 DIAGNOSIS — I10 Essential (primary) hypertension: Secondary | ICD-10-CM | POA: Diagnosis not present

## 2016-12-19 DIAGNOSIS — M9903 Segmental and somatic dysfunction of lumbar region: Secondary | ICD-10-CM | POA: Diagnosis not present

## 2016-12-19 DIAGNOSIS — M5417 Radiculopathy, lumbosacral region: Secondary | ICD-10-CM | POA: Diagnosis not present

## 2016-12-19 DIAGNOSIS — M5032 Other cervical disc degeneration, mid-cervical region, unspecified level: Secondary | ICD-10-CM | POA: Diagnosis not present

## 2016-12-19 DIAGNOSIS — M5414 Radiculopathy, thoracic region: Secondary | ICD-10-CM | POA: Diagnosis not present

## 2016-12-19 DIAGNOSIS — M9901 Segmental and somatic dysfunction of cervical region: Secondary | ICD-10-CM | POA: Diagnosis not present

## 2016-12-19 DIAGNOSIS — M9902 Segmental and somatic dysfunction of thoracic region: Secondary | ICD-10-CM | POA: Diagnosis not present

## 2016-12-19 DIAGNOSIS — I10 Essential (primary) hypertension: Secondary | ICD-10-CM | POA: Diagnosis not present

## 2017-01-30 DIAGNOSIS — R69 Illness, unspecified: Secondary | ICD-10-CM | POA: Diagnosis not present

## 2017-06-29 DIAGNOSIS — H524 Presbyopia: Secondary | ICD-10-CM | POA: Diagnosis not present

## 2017-08-07 DIAGNOSIS — R69 Illness, unspecified: Secondary | ICD-10-CM | POA: Diagnosis not present

## 2017-11-01 DIAGNOSIS — Z0000 Encounter for general adult medical examination without abnormal findings: Secondary | ICD-10-CM | POA: Diagnosis not present

## 2017-11-01 DIAGNOSIS — E7800 Pure hypercholesterolemia, unspecified: Secondary | ICD-10-CM | POA: Diagnosis not present

## 2017-11-01 DIAGNOSIS — I10 Essential (primary) hypertension: Secondary | ICD-10-CM | POA: Diagnosis not present

## 2017-11-09 DIAGNOSIS — E7800 Pure hypercholesterolemia, unspecified: Secondary | ICD-10-CM | POA: Diagnosis not present

## 2017-11-09 DIAGNOSIS — Z0000 Encounter for general adult medical examination without abnormal findings: Secondary | ICD-10-CM | POA: Diagnosis not present

## 2017-11-09 DIAGNOSIS — I10 Essential (primary) hypertension: Secondary | ICD-10-CM | POA: Diagnosis not present

## 2017-11-13 DIAGNOSIS — R7309 Other abnormal glucose: Secondary | ICD-10-CM | POA: Diagnosis not present

## 2018-03-20 DIAGNOSIS — R69 Illness, unspecified: Secondary | ICD-10-CM | POA: Diagnosis not present

## 2018-08-11 ENCOUNTER — Encounter: Payer: Self-pay | Admitting: Internal Medicine

## 2018-11-09 DIAGNOSIS — R7303 Prediabetes: Secondary | ICD-10-CM | POA: Diagnosis not present

## 2018-11-09 DIAGNOSIS — Z0000 Encounter for general adult medical examination without abnormal findings: Secondary | ICD-10-CM | POA: Diagnosis not present

## 2018-11-09 DIAGNOSIS — E7800 Pure hypercholesterolemia, unspecified: Secondary | ICD-10-CM | POA: Diagnosis not present

## 2018-11-12 DIAGNOSIS — M899 Disorder of bone, unspecified: Secondary | ICD-10-CM | POA: Diagnosis not present

## 2018-11-12 DIAGNOSIS — I10 Essential (primary) hypertension: Secondary | ICD-10-CM | POA: Diagnosis not present

## 2018-11-23 ENCOUNTER — Other Ambulatory Visit: Payer: Self-pay | Admitting: Family Medicine

## 2018-11-23 DIAGNOSIS — Z1231 Encounter for screening mammogram for malignant neoplasm of breast: Secondary | ICD-10-CM

## 2018-11-23 DIAGNOSIS — M8580 Other specified disorders of bone density and structure, unspecified site: Secondary | ICD-10-CM

## 2019-01-15 ENCOUNTER — Ambulatory Visit
Admission: RE | Admit: 2019-01-15 | Discharge: 2019-01-15 | Disposition: A | Discharge status: Home / Self Care | Payer: Medicare HMO | Source: Ambulatory Visit | Attending: Family Medicine | Admitting: Family Medicine

## 2019-01-15 ENCOUNTER — Other Ambulatory Visit: Payer: Self-pay

## 2019-01-15 DIAGNOSIS — M8589 Other specified disorders of bone density and structure, multiple sites: Secondary | ICD-10-CM | POA: Diagnosis not present

## 2019-01-15 DIAGNOSIS — Z1231 Encounter for screening mammogram for malignant neoplasm of breast: Secondary | ICD-10-CM

## 2019-01-15 DIAGNOSIS — M8580 Other specified disorders of bone density and structure, unspecified site: Secondary | ICD-10-CM

## 2019-01-15 DIAGNOSIS — Z780 Asymptomatic menopausal state: Secondary | ICD-10-CM | POA: Diagnosis not present

## 2019-02-04 DIAGNOSIS — L820 Inflamed seborrheic keratosis: Secondary | ICD-10-CM | POA: Diagnosis not present

## 2019-02-04 DIAGNOSIS — D485 Neoplasm of uncertain behavior of skin: Secondary | ICD-10-CM | POA: Diagnosis not present

## 2019-02-04 DIAGNOSIS — T7840XA Allergy, unspecified, initial encounter: Secondary | ICD-10-CM | POA: Diagnosis not present

## 2019-02-04 DIAGNOSIS — D1801 Hemangioma of skin and subcutaneous tissue: Secondary | ICD-10-CM | POA: Diagnosis not present

## 2019-02-04 DIAGNOSIS — L918 Other hypertrophic disorders of the skin: Secondary | ICD-10-CM | POA: Diagnosis not present

## 2019-02-04 DIAGNOSIS — L570 Actinic keratosis: Secondary | ICD-10-CM | POA: Diagnosis not present

## 2019-02-04 DIAGNOSIS — L821 Other seborrheic keratosis: Secondary | ICD-10-CM | POA: Diagnosis not present

## 2019-02-04 DIAGNOSIS — D2261 Melanocytic nevi of right upper limb, including shoulder: Secondary | ICD-10-CM | POA: Diagnosis not present

## 2019-02-13 ENCOUNTER — Encounter: Payer: Self-pay | Admitting: Internal Medicine

## 2019-03-21 ENCOUNTER — Ambulatory Visit (AMBULATORY_SURGERY_CENTER): Payer: Self-pay

## 2019-03-21 ENCOUNTER — Other Ambulatory Visit: Payer: Self-pay

## 2019-03-21 ENCOUNTER — Encounter: Payer: Self-pay | Admitting: Internal Medicine

## 2019-03-21 VITALS — Temp 96.8°F | Ht 63.0 in | Wt 166.6 lb

## 2019-03-21 DIAGNOSIS — Z86010 Personal history of colonic polyps: Secondary | ICD-10-CM

## 2019-03-21 MED ORDER — NA SULFATE-K SULFATE-MG SULF 17.5-3.13-1.6 GM/177ML PO SOLN: 1.0000 | Freq: Once | ORAL | 0 refills | Status: AC

## 2019-03-21 NOTE — Progress Notes (Signed)
Denies allergies to eggs or soy products. Denies complication of anesthesia or sedation. Denies use of weight loss medication. Denies use of O2.   Emmi instructions given for colonoscopy.  

## 2019-03-28 DIAGNOSIS — R69 Illness, unspecified: Secondary | ICD-10-CM | POA: Diagnosis not present

## 2019-04-03 ENCOUNTER — Telehealth: Payer: Self-pay | Admitting: Internal Medicine

## 2019-04-03 NOTE — Telephone Encounter (Signed)

## 2019-04-04 ENCOUNTER — Ambulatory Visit (AMBULATORY_SURGERY_CENTER): Payer: Medicare HMO | Admitting: Internal Medicine

## 2019-04-04 ENCOUNTER — Encounter: Payer: Self-pay | Admitting: Internal Medicine

## 2019-04-04 ENCOUNTER — Other Ambulatory Visit: Payer: Self-pay

## 2019-04-04 VITALS — BP 140/77 | HR 63 | Temp 98.1°F | Resp 13 | Ht 63.0 in | Wt 166.0 lb

## 2019-04-04 DIAGNOSIS — D122 Benign neoplasm of ascending colon: Secondary | ICD-10-CM | POA: Diagnosis not present

## 2019-04-04 DIAGNOSIS — D120 Benign neoplasm of cecum: Secondary | ICD-10-CM | POA: Diagnosis not present

## 2019-04-04 DIAGNOSIS — I10 Essential (primary) hypertension: Secondary | ICD-10-CM | POA: Diagnosis not present

## 2019-04-04 DIAGNOSIS — D123 Benign neoplasm of transverse colon: Secondary | ICD-10-CM | POA: Diagnosis not present

## 2019-04-04 DIAGNOSIS — E7800 Pure hypercholesterolemia, unspecified: Secondary | ICD-10-CM | POA: Diagnosis not present

## 2019-04-04 DIAGNOSIS — Z86010 Personal history of colonic polyps: Secondary | ICD-10-CM | POA: Diagnosis not present

## 2019-04-04 MED ORDER — SODIUM CHLORIDE 0.9 % IV SOLN: 500.0000 mL | INTRAVENOUS | Status: DC

## 2019-04-04 NOTE — Patient Instructions (Signed)
HANDOUTS PROVIDED ON: POLYPS & DIVERTICULOSIS  THE POLYPS REMOVED HAVE BEEN SENT TO PATHOLOGY.  THE RESULTS CAN TAKE 2-3 WEEKS TO RECEIVE.  YOUR NEXT RECOMMENDED COLONOSCOPY WILL DEPEND ON THE PATHOLOGY RESULTS.  YOU MAY RESUME YOUR PREVIOUS DIET AND MEDICATION SCHEDULE TODAY.  [place] YOU FOR ALLOWING [place] TO CARE FOR YOU TODAY!!  YOU HAD AN ENDOSCOPIC PROCEDURE TODAY AT [facility]:   Refer to the procedure report that was given to you for any specific questions about what was found during the examination.  If the procedure report does not answer your questions, please call your gastroenterologist to clarify.  If you requested that your care partner not be given the details of your procedure findings, then the procedure report has been included in a sealed envelope for you to review at your convenience later.  YOU SHOULD EXPECT: Some feelings of bloating in the abdomen. Passage of more gas than usual.  Walking can help get rid of the air that was put into your GI tract during the procedure and reduce the bloating. If you had a lower endoscopy (such as a colonoscopy or flexible sigmoidoscopy) you may notice spotting of blood in your stool or on the toilet paper. If you underwent a bowel prep for your procedure, you may not have a normal bowel movement for a few days.  Please Note:  You might notice some irritation and congestion in your nose or some drainage.  This is from the oxygen used during your procedure.  There is no need for concern and it should clear up in a day or so.  SYMPTOMS TO REPORT IMMEDIATELY:   Following lower endoscopy (colonoscopy or flexible sigmoidoscopy):  Excessive amounts of blood in the stool  Significant tenderness or worsening of abdominal pains  Swelling of the abdomen that is new, acute  Fever of 100F or higher   For urgent or emergent issues, a gastroenterologist can be reached at any hour by calling [phone].   DIET:  We do recommend a  small meal at first, but then you may proceed to your regular diet.  Drink plenty of fluids but you should avoid alcoholic beverages for 24 hours.  ACTIVITY:  You should plan to take it easy for the rest of today and you should NOT DRIVE or use heavy machinery until tomorrow (because of the sedation medicines used during the test).    FOLLOW UP: Our staff will call the number listed on your records 48-72 hours following your procedure to check on you and address any questions or concerns that you may have regarding the information given to you following your procedure. If we do not reach you, we will leave a message.  We will attempt to reach you two times.  During this call, we will ask if you have developed any symptoms of COVID 19. If you develop any symptoms (ie: fever, flu-like symptoms, shortness of breath, cough etc.) before then, please call [phone].  If you test positive for Covid 19 in the 2 weeks post procedure, please call and report this information to [place].    If any biopsies were taken you will be contacted by phone or by letter within the next 1-3 weeks.  Please call us at [phone] if you have not heard about the biopsies in 3 weeks.    SIGNATURES/CONFIDENTIALITY: You and/or your care partner have signed paperwork which will be entered into your electronic medical record.  These signatures attest to the fact that  that the information above on your After Visit Summary has been reviewed and is understood.  Full responsibility of the confidentiality of this discharge information lies with you and/or your care-partner.

## 2019-04-04 NOTE — Op Note (Signed)
[facility] Patient Name: Karen Rangel Procedure Date: 04/04/2019 10:02 AM MRN: [id number] Endoscopist: Jerene Bears , MD Age: 68 Referring MD:  Date of Birth: 1950/07/30 Gender: Female Account #: [id number] Procedure:                Colonoscopy Indications:              Surveillance: Personal history of adenomatous                            polyps on last colonoscopy 5 years ago Medicines:                Monitored Anesthesia Care Procedure:                Pre-Anesthesia Assessment:                           - Prior to the procedure, a History and Physical                            was performed, and patient medications and                            allergies were reviewed. The patient's tolerance of                            previous anesthesia was also reviewed. The risks                            and benefits of the procedure and the sedation                            options and risks were discussed with the patient.                            All questions were answered, and informed consent                            was obtained. Prior Anticoagulants: The patient has                            taken no previous anticoagulant or antiplatelet                            agents. ASA Grade Assessment: II - A patient with                            mild systemic disease. After reviewing the risks                            and benefits, the patient was deemed in                            satisfactory condition to undergo the procedure.  After obtaining informed consent, the colonoscope                            was passed under direct vision. Throughout the                            procedure, the patient's blood pressure, pulse, and                            oxygen saturations were monitored continuously. The                            Colonoscope was introduced through the anus and                            advanced to the cecum,  identified by appendiceal                            orifice and ileocecal valve. The colonoscopy was                            performed without difficulty. The colonoscopy was                            somewhat difficult due to significant looping. The                            patient tolerated the procedure well. The quality                            of the bowel preparation was excellent. The                            ileocecal valve, appendiceal orifice, and rectum                            were photographed. Scope In: 10:13:22 AM Scope Out: 10:35:36 AM Scope Withdrawal Time: 0 hours 16 minutes 14 seconds  Total Procedure Duration: 0 hours 22 minutes 14 seconds  Findings:                 The digital rectal exam was normal.                           Two sessile polyps were found in the cecum. The                            polyps were 3 to 4 mm in size. These polyps were                            removed with a cold snare. Resection and retrieval                            were complete.  A 6 mm polyp was found in the ascending colon. The                            polyp was sessile. The polyp was removed with a                            cold snare. Resection and retrieval were complete.                           Two sessile polyps were found in the transverse                            colon. The polyps were 4 to 5 mm in size. These                            polyps were removed with a cold snare. Resection                            and retrieval were complete.                           A few small-mouthed diverticula were found in the                            hepatic flexure and ascending colon.                           The retroflexed view of the distal rectum and anal                            verge was normal and showed no anal or rectal                            abnormalities. Complications:            No immediate  complications. Estimated Blood Loss:     Estimated blood loss was minimal. Impression:               - Two 3 to 4 mm polyps in the cecum, removed with a                            cold snare. Resected and retrieved.                           - One 6 mm polyp in the ascending colon, removed                            with a cold snare. Resected and retrieved.                           - Two 4 to 5 mm polyps in the transverse colon,  removed with a cold snare. Resected and retrieved.                           - Diverticulosis at the hepatic flexure and in the                            ascending colon.                           - The distal rectum and anal verge are normal on                            retroflexion view. Recommendation:           - Patient has a contact number available for                            emergencies. The signs and symptoms of potential                            delayed complications were discussed with the                            patient. Return to normal activities tomorrow.                            Written discharge instructions were provided to the                            patient.                           - Resume previous diet.                           - Continue present medications.                           - Await pathology results.                           - Repeat colonoscopy is recommended for                            surveillance. The colonoscopy date will be                            determined after pathology results from today's                            exam become available for review. Jerene Bears, MD 04/04/2019 10:39:29 AM This report has been signed electronically.

## 2019-04-04 NOTE — Progress Notes (Signed)
Called to room to assist during endoscopic procedure.  Patient ID and intended procedure confirmed with present staff. Received instructions for my participation in the procedure from the performing physician.  

## 2019-04-04 NOTE — Progress Notes (Signed)
Report given to PACU, vss 

## 2019-04-08 ENCOUNTER — Telehealth: Payer: Self-pay

## 2019-04-08 NOTE — Telephone Encounter (Signed)
  Follow up Call-  Call back number 04/04/2019  Post procedure Call Back phone  # [phone]  Permission to leave phone message Yes  Some recent data might be hidden     Patient questions:  Do you have a fever, pain , or abdominal swelling? No. Pain Score  0 *  Have you tolerated food without any problems? Yes.    Have you been able to return to your normal activities? Yes.    Do you have any questions about your discharge instructions: Diet   No. Medications  No. Follow up visit  No.  Do you have questions or concerns about your Care? No.  Actions: * If pain score is 4 or above: No action needed, pain <4.  1. Have you developed a fever since your procedure? no  2.   Have you had an respiratory symptoms (SOB or cough) since your procedure? no  3.   Have you tested positive for COVID 19 since your procedure no  4.   Have you had any family members/close contacts diagnosed with the COVID 19 since your procedure?  no   If yes to any of these questions please route to Joylene John, RN and Alphonsa Gin, Therapist, sports.

## 2019-04-11 ENCOUNTER — Encounter: Payer: Self-pay | Admitting: Internal Medicine

## 2019-04-18 DIAGNOSIS — R69 Illness, unspecified: Secondary | ICD-10-CM | POA: Diagnosis not present

## 2019-10-17 DIAGNOSIS — H524 Presbyopia: Secondary | ICD-10-CM | POA: Diagnosis not present

## 2019-11-07 DIAGNOSIS — R69 Illness, unspecified: Secondary | ICD-10-CM | POA: Diagnosis not present

## 2019-11-21 DIAGNOSIS — I10 Essential (primary) hypertension: Secondary | ICD-10-CM | POA: Diagnosis not present

## 2019-11-21 DIAGNOSIS — E7800 Pure hypercholesterolemia, unspecified: Secondary | ICD-10-CM | POA: Diagnosis not present

## 2019-12-02 DIAGNOSIS — G44209 Tension-type headache, unspecified, not intractable: Secondary | ICD-10-CM | POA: Diagnosis not present

## 2019-12-02 DIAGNOSIS — E7800 Pure hypercholesterolemia, unspecified: Secondary | ICD-10-CM | POA: Diagnosis not present

## 2019-12-02 DIAGNOSIS — R7301 Impaired fasting glucose: Secondary | ICD-10-CM | POA: Diagnosis not present

## 2019-12-02 DIAGNOSIS — Z0000 Encounter for general adult medical examination without abnormal findings: Secondary | ICD-10-CM | POA: Diagnosis not present

## 2019-12-02 DIAGNOSIS — I10 Essential (primary) hypertension: Secondary | ICD-10-CM | POA: Diagnosis not present

## 2019-12-04 DIAGNOSIS — H524 Presbyopia: Secondary | ICD-10-CM | POA: Diagnosis not present

## 2020-03-05 DIAGNOSIS — I10 Essential (primary) hypertension: Secondary | ICD-10-CM | POA: Diagnosis not present

## 2020-03-05 DIAGNOSIS — Z833 Family history of diabetes mellitus: Secondary | ICD-10-CM | POA: Diagnosis not present

## 2020-03-05 DIAGNOSIS — Z008 Encounter for other general examination: Secondary | ICD-10-CM | POA: Diagnosis not present

## 2020-03-05 DIAGNOSIS — Z8249 Family history of ischemic heart disease and other diseases of the circulatory system: Secondary | ICD-10-CM | POA: Diagnosis not present

## 2020-03-05 DIAGNOSIS — Z809 Family history of malignant neoplasm, unspecified: Secondary | ICD-10-CM | POA: Diagnosis not present

## 2020-03-05 DIAGNOSIS — M1990 Unspecified osteoarthritis, unspecified site: Secondary | ICD-10-CM | POA: Diagnosis not present

## 2020-12-21 DIAGNOSIS — Z0000 Encounter for general adult medical examination without abnormal findings: Secondary | ICD-10-CM | POA: Diagnosis not present

## 2020-12-21 DIAGNOSIS — Z1389 Encounter for screening for other disorder: Secondary | ICD-10-CM | POA: Diagnosis not present

## 2020-12-23 ENCOUNTER — Other Ambulatory Visit: Payer: Self-pay | Admitting: Family Medicine

## 2020-12-23 DIAGNOSIS — Z1231 Encounter for screening mammogram for malignant neoplasm of breast: Secondary | ICD-10-CM

## 2020-12-23 DIAGNOSIS — M8580 Other specified disorders of bone density and structure, unspecified site: Secondary | ICD-10-CM

## 2020-12-28 DIAGNOSIS — E7800 Pure hypercholesterolemia, unspecified: Secondary | ICD-10-CM | POA: Diagnosis not present

## 2020-12-28 DIAGNOSIS — R6889 Other general symptoms and signs: Secondary | ICD-10-CM | POA: Diagnosis not present

## 2020-12-28 DIAGNOSIS — I10 Essential (primary) hypertension: Secondary | ICD-10-CM | POA: Diagnosis not present

## 2020-12-28 DIAGNOSIS — Z23 Encounter for immunization: Secondary | ICD-10-CM | POA: Diagnosis not present

## 2020-12-28 DIAGNOSIS — M7710 Lateral epicondylitis, unspecified elbow: Secondary | ICD-10-CM | POA: Diagnosis not present

## 2020-12-28 DIAGNOSIS — M8580 Other specified disorders of bone density and structure, unspecified site: Secondary | ICD-10-CM | POA: Diagnosis not present

## 2021-01-29 DIAGNOSIS — J342 Deviated nasal septum: Secondary | ICD-10-CM | POA: Diagnosis not present

## 2021-01-29 DIAGNOSIS — J343 Hypertrophy of nasal turbinates: Secondary | ICD-10-CM | POA: Diagnosis not present

## 2021-01-29 DIAGNOSIS — R0981 Nasal congestion: Secondary | ICD-10-CM | POA: Diagnosis not present

## 2021-06-17 ENCOUNTER — Ambulatory Visit
Admission: RE | Admit: 2021-06-17 | Discharge: 2021-06-17 | Disposition: A | Discharge status: Home / Self Care | Payer: Medicare HMO | Source: Ambulatory Visit | Attending: Family Medicine | Admitting: Family Medicine

## 2021-06-17 DIAGNOSIS — M8589 Other specified disorders of bone density and structure, multiple sites: Secondary | ICD-10-CM | POA: Diagnosis not present

## 2021-06-17 DIAGNOSIS — Z1231 Encounter for screening mammogram for malignant neoplasm of breast: Secondary | ICD-10-CM | POA: Diagnosis not present

## 2021-06-17 DIAGNOSIS — Z780 Asymptomatic menopausal state: Secondary | ICD-10-CM | POA: Diagnosis not present

## 2021-06-17 DIAGNOSIS — M8580 Other specified disorders of bone density and structure, unspecified site: Secondary | ICD-10-CM

## 2021-07-01 DIAGNOSIS — H5203 Hypermetropia, bilateral: Secondary | ICD-10-CM | POA: Diagnosis not present

## 2021-09-02 DIAGNOSIS — L821 Other seborrheic keratosis: Secondary | ICD-10-CM | POA: Diagnosis not present

## 2021-09-02 DIAGNOSIS — D2262 Melanocytic nevi of left upper limb, including shoulder: Secondary | ICD-10-CM | POA: Diagnosis not present

## 2021-09-02 DIAGNOSIS — L918 Other hypertrophic disorders of the skin: Secondary | ICD-10-CM | POA: Diagnosis not present

## 2021-12-22 DIAGNOSIS — Z0000 Encounter for general adult medical examination without abnormal findings: Secondary | ICD-10-CM | POA: Diagnosis not present

## 2021-12-22 DIAGNOSIS — Z1389 Encounter for screening for other disorder: Secondary | ICD-10-CM | POA: Diagnosis not present

## 2021-12-30 DIAGNOSIS — I10 Essential (primary) hypertension: Secondary | ICD-10-CM | POA: Diagnosis not present

## 2021-12-30 DIAGNOSIS — R7303 Prediabetes: Secondary | ICD-10-CM | POA: Diagnosis not present

## 2021-12-30 DIAGNOSIS — M792 Neuralgia and neuritis, unspecified: Secondary | ICD-10-CM | POA: Diagnosis not present

## 2021-12-30 DIAGNOSIS — E7800 Pure hypercholesterolemia, unspecified: Secondary | ICD-10-CM | POA: Diagnosis not present

## 2021-12-30 DIAGNOSIS — Z79899 Other long term (current) drug therapy: Secondary | ICD-10-CM | POA: Diagnosis not present

## 2021-12-30 DIAGNOSIS — J302 Other seasonal allergic rhinitis: Secondary | ICD-10-CM | POA: Diagnosis not present

## 2021-12-30 DIAGNOSIS — M8580 Other specified disorders of bone density and structure, unspecified site: Secondary | ICD-10-CM | POA: Diagnosis not present

## 2022-02-11 ENCOUNTER — Encounter: Payer: Self-pay | Admitting: Internal Medicine

## 2022-03-14 ENCOUNTER — Ambulatory Visit (AMBULATORY_SURGERY_CENTER): Payer: Self-pay

## 2022-03-14 VITALS — Ht 62.0 in | Wt 153.0 lb

## 2022-03-14 DIAGNOSIS — Z86010 Personal history of colonic polyps: Secondary | ICD-10-CM

## 2022-03-14 MED ORDER — NA SULFATE-K SULFATE-MG SULF 17.5-3.13-1.6 GM/177ML PO SOLN: 1.0000 | Freq: Once | ORAL | 0 refills | Status: AC

## 2022-03-14 NOTE — Progress Notes (Signed)
No egg or soy allergy known to patient  No issues known to pt with past sedation with any surgeries or procedures Patient denies ever being told they had issues or difficulty with intubation  No FH of Malignant Hyperthermia Pt is not on diet pills Pt is not on  home 02  Pt is not on blood thinners  Pt denies issues with constipation  No A fib or A flutter Have any cardiac testing pending--no Pt instructed to use Singlecare.com or GoodRx for a price reduction on prep   

## 2022-03-16 ENCOUNTER — Encounter (HOSPITAL_BASED_OUTPATIENT_CLINIC_OR_DEPARTMENT_OTHER): Payer: Self-pay

## 2022-03-16 ENCOUNTER — Emergency Department (HOSPITAL_BASED_OUTPATIENT_CLINIC_OR_DEPARTMENT_OTHER): Payer: Medicare HMO | Admitting: Radiology

## 2022-03-16 ENCOUNTER — Other Ambulatory Visit: Payer: Self-pay

## 2022-03-16 ENCOUNTER — Emergency Department (HOSPITAL_BASED_OUTPATIENT_CLINIC_OR_DEPARTMENT_OTHER)
Admission: EM | Admit: 2022-03-16 | Discharge: 2022-03-17 | Disposition: A | Discharge status: Home / Self Care | Payer: Medicare HMO | Attending: Emergency Medicine | Admitting: Emergency Medicine

## 2022-03-16 DIAGNOSIS — Z20822 Contact with and (suspected) exposure to covid-19: Secondary | ICD-10-CM | POA: Diagnosis not present

## 2022-03-16 DIAGNOSIS — R509 Fever, unspecified: Secondary | ICD-10-CM | POA: Diagnosis present

## 2022-03-16 DIAGNOSIS — R110 Nausea: Secondary | ICD-10-CM | POA: Insufficient documentation

## 2022-03-16 DIAGNOSIS — R059 Cough, unspecified: Secondary | ICD-10-CM | POA: Insufficient documentation

## 2022-03-16 DIAGNOSIS — R0981 Nasal congestion: Secondary | ICD-10-CM | POA: Diagnosis not present

## 2022-03-16 DIAGNOSIS — E878 Other disorders of electrolyte and fluid balance, not elsewhere classified: Secondary | ICD-10-CM | POA: Insufficient documentation

## 2022-03-16 DIAGNOSIS — Z79899 Other long term (current) drug therapy: Secondary | ICD-10-CM | POA: Insufficient documentation

## 2022-03-16 DIAGNOSIS — M791 Myalgia, unspecified site: Secondary | ICD-10-CM | POA: Diagnosis not present

## 2022-03-16 DIAGNOSIS — E871 Hypo-osmolality and hyponatremia: Secondary | ICD-10-CM | POA: Diagnosis not present

## 2022-03-16 DIAGNOSIS — B999 Unspecified infectious disease: Secondary | ICD-10-CM

## 2022-03-16 DIAGNOSIS — I10 Essential (primary) hypertension: Secondary | ICD-10-CM | POA: Insufficient documentation

## 2022-03-16 DIAGNOSIS — B349 Viral infection, unspecified: Secondary | ICD-10-CM | POA: Diagnosis not present

## 2022-03-16 DIAGNOSIS — R519 Headache, unspecified: Secondary | ICD-10-CM | POA: Diagnosis not present

## 2022-03-16 LAB — RESP PANEL BY RT-PCR (FLU A&B, COVID) ARPGX2
Influenza A by PCR: NEGATIVE
Influenza B by PCR: NEGATIVE
SARS Coronavirus 2 by RT PCR: NEGATIVE

## 2022-03-16 MED ORDER — IBUPROFEN 800 MG PO TABS
ORAL_TABLET | ORAL | Status: AC
  Filled 2022-03-16: qty 1

## 2022-03-16 MED ORDER — IBUPROFEN 800 MG PO TABS
800.0000 mg | ORAL_TABLET | Freq: Once | ORAL | Status: AC
  Administered 2022-03-16: 800 mg via ORAL

## 2022-03-16 NOTE — ED Triage Notes (Signed)
Pt states she began having flu-like symptoms yesterday morning.   Temp as high as 103, states tylenol is not helping reduce temp.  +nausea no vomiting Aching all over and fatigued

## 2022-03-17 DIAGNOSIS — R509 Fever, unspecified: Secondary | ICD-10-CM | POA: Diagnosis not present

## 2022-03-17 LAB — CBC WITH DIFFERENTIAL/PLATELET
Abs Immature Granulocytes: 0.01 10*3/uL (ref 0.00–0.07)
Basophils Absolute: 0 10*3/uL (ref 0.0–0.1)
Basophils Relative: 0 %
Eosinophils Absolute: 0 10*3/uL (ref 0.0–0.5)
Eosinophils Relative: 0 %
HCT: 38.5 % (ref 36.0–46.0)
Hemoglobin: 14.1 g/dL (ref 12.0–15.0)
Immature Granulocytes: 0 %
Lymphocytes Relative: 14 %
Lymphs Abs: 0.6 10*3/uL — ABNORMAL LOW (ref 0.7–4.0)
MCH: 31.9 pg (ref 26.0–34.0)
MCHC: 36.6 g/dL — ABNORMAL HIGH (ref 30.0–36.0)
MCV: 87.1 fL (ref 80.0–100.0)
Monocytes Absolute: 0.2 10*3/uL (ref 0.1–1.0)
Monocytes Relative: 6 %
Neutro Abs: 3.3 10*3/uL (ref 1.7–7.7)
Neutrophils Relative %: 80 %
Platelets: 149 10*3/uL — ABNORMAL LOW (ref 150–400)
RBC: 4.42 MIL/uL (ref 3.87–5.11)
RDW: 12.7 % (ref 11.5–15.5)
WBC: 4.1 10*3/uL (ref 4.0–10.5)
nRBC: 0 % (ref 0.0–0.2)

## 2022-03-17 LAB — COMPREHENSIVE METABOLIC PANEL
ALT: 30 U/L (ref 0–44)
AST: 27 U/L (ref 15–41)
Albumin: 4.4 g/dL (ref 3.5–5.0)
Alkaline Phosphatase: 52 U/L (ref 38–126)
Anion gap: 11 (ref 5–15)
BUN: 12 mg/dL (ref 8–23)
CO2: 24 mmol/L (ref 22–32)
Calcium: 9.3 mg/dL (ref 8.9–10.3)
Chloride: 93 mmol/L — ABNORMAL LOW (ref 98–111)
Creatinine, Ser: 0.63 mg/dL (ref 0.44–1.00)
GFR, Estimated: >60 mL/min (ref >60)
Glucose, Bld: 173 mg/dL — ABNORMAL HIGH (ref 70–99)
Potassium: 3.3 mmol/L — ABNORMAL LOW (ref 3.5–5.1)
Sodium: 128 mmol/L — ABNORMAL LOW (ref 135–145)
Total Bilirubin: 0.9 mg/dL (ref 0.3–1.2)
Total Protein: 6.9 g/dL (ref 6.5–8.1)

## 2022-03-17 LAB — URINALYSIS, ROUTINE W REFLEX MICROSCOPIC
Bilirubin Urine: NEGATIVE
Glucose, UA: NEGATIVE mg/dL
Leukocytes,Ua: NEGATIVE
Nitrite: NEGATIVE
Protein, ur: NEGATIVE mg/dL
Specific Gravity, Urine: 1.014 (ref 1.005–1.030)
pH: 6 (ref 5.0–8.0)

## 2022-03-17 LAB — LACTIC ACID, PLASMA: Lactic Acid, Venous: 0.8 mmol/L (ref 0.5–1.9)

## 2022-03-17 MED ORDER — SODIUM CHLORIDE 0.9 % IV BOLUS
1000.0000 mL | Freq: Once | INTRAVENOUS | Status: AC
  Administered 2022-03-17: 1000 mL via INTRAVENOUS

## 2022-03-17 NOTE — ED Provider Notes (Signed)
[facility]PT Provider Note  CSN: [id number] Arrival date & time: 03/16/22 2104  Chief Complaint(s) flu-like symptoms  HPI Karen Rangel is a 71 y.o. female with a past medical history listed below who presents to the emergency department with 2 days of fevers, myalgias, nasal congestion, cough, nausea, and headache.  Patient denies any known sick contacts.  No vomiting.  No diarrhea.  No abdominal pain.  No urinary symptoms.  She did report getting the COVID-vaccine last week but felt fine afterwards.   HPI  Past Medical History Past Medical History:  Diagnosis Date   Allergy    Anemia    Arthritis    Hyperlipidemia    Hypertension    Osteopenia    Patient Active Problem List   Diagnosis Date Noted   HTN (hypertension) 07/24/2013   Home Medication(s) Prior to Admission medications   Medication Sig Start Date End Date Taking? Authorizing Provider  Calcium Carbonate-Vitamin D (CALCIUM-D) 600-400 MG-UNIT TABS Take 1 tablet by mouth every morning.    [provider]  Cholecalciferol (VITAMIN D3) 2000 UNITS TABS Take 1 tablet by mouth daily.    [provider]  Cyanocobalamin (B-12) 100 MCG TABS Take by mouth.    [provider]  fluticasone (FLONASE) 50 MCG/ACT nasal spray Place 2 sprays into both nostrils daily. 02/22/22   [provider]  Glucosamine-Chondroit-Vit C-Mn (GLUCOSAMINE 1500 COMPLEX PO) Take 1 tablet by mouth every morning.    [provider]  hydrochlorothiazide (HYDRODIURIL) 25 MG tablet Take 25 mg by mouth daily.    [provider]  Multiple Vitamin (MULTIVITAMIN WITH MINERALS) TABS tablet Take 1 tablet by mouth daily.    [provider]                                                                                                                                    Allergies Norvasc [amlodipine]  Review of Systems Review of Systems As noted in HPI  Physical Exam Vital  Signs  I have reviewed the triage vital signs BP 138/70   Pulse (!) 59   Temp 98.6 F (37 C)   Resp 18   Ht '5\' 2"'$  (1.575 m)   Wt 68 kg   SpO2 96%   BMI 27.44 kg/m   Physical Exam Vitals reviewed.  Constitutional:      General: She is not in acute distress.    Appearance: She is well-developed. She is not diaphoretic.  HENT:     Head: Normocephalic and atraumatic.     Right Ear: Tympanic membrane normal.     Left Ear: Tympanic membrane normal.     Nose: Nose normal.     Mouth/Throat:     Mouth: No oral lesions.     Tongue: No lesions.     Palate: No lesions.     Pharynx: No oropharyngeal exudate or posterior oropharyngeal erythema.  Tonsils: No tonsillar exudate or tonsillar abscesses.  Eyes:     General: No scleral icterus.       Right eye: No discharge.        Left eye: No discharge.     Conjunctiva/sclera: Conjunctivae normal.     Pupils: Pupils are equal, round, and reactive to light.  Cardiovascular:     Rate and Rhythm: Normal rate and regular rhythm.     Heart sounds: No murmur heard.    No friction rub. No gallop.  Pulmonary:     Effort: Pulmonary effort is normal. No respiratory distress.     Breath sounds: Normal breath sounds. No stridor. No rales.  Abdominal:     General: There is no distension.     Palpations: Abdomen is soft.     Tenderness: There is no abdominal tenderness.  Musculoskeletal:        General: No tenderness.     Cervical back: Normal range of motion and neck supple.  Skin:    General: Skin is warm and dry.     Findings: No erythema or rash.  Neurological:     Mental Status: She is alert and oriented to person, place, and time.     ED Results and Treatments Labs (all labs ordered are listed, but only abnormal results are displayed) Labs Reviewed  URINALYSIS, ROUTINE W REFLEX MICROSCOPIC - Abnormal; Notable for the following components:      Result Value   Hgb urine dipstick TRACE (*)    Ketones, ur TRACE (*)    Bacteria,  UA RARE (*)    All other components within normal limits  CBC WITH DIFFERENTIAL/PLATELET - Abnormal; Notable for the following components:   MCHC 36.6 (*)    Platelets 149 (*)    Lymphs Abs 0.6 (*)    All other components within normal limits  COMPREHENSIVE METABOLIC PANEL - Abnormal; Notable for the following components:   Sodium 128 (*)    Potassium 3.3 (*)    Chloride 93 (*)    Glucose, Bld 173 (*)    All other components within normal limits  RESP PANEL BY RT-PCR (FLU A&B, COVID) [id number]  LACTIC ACID, PLASMA  LACTIC ACID, PLASMA                                                                                                                         EKG  EKG Interpretation  Date/Time:    Ventricular Rate:    PR Interval:    QRS Duration:   QT Interval:    QTC Calculation:   R Axis:     Text Interpretation:         Radiology DG Chest 2 View  Result Date: 03/17/2022 CLINICAL DATA:  Fever, flu like symptoms EXAM: CHEST - 2 VIEW COMPARISON:  None Available. FINDINGS: Lungs are clear.  No pleural effusion or pneumothorax. The heart is normal in size. Visualized osseous structures are within normal limits. IMPRESSION: Normal chest radiographs.  Electronically Signed   By: Julian Hy M.D.   On: 03/17/2022 00:16    Medications Ordered in ED Medications  ibuprofen (ADVIL) 800 MG tablet (  Not Given 03/16/22 2140)  ibuprofen (ADVIL) tablet 800 mg (800 mg Oral Given 03/16/22 2120)  sodium chloride 0.9 % bolus 1,000 mL (1,000 mLs Intravenous New Bag/Given 03/17/22 0026)                                                                                                                                     Procedures Procedures  (including critical care time)  Medical Decision Making / ED Course   Medical Decision Making Amount and/or Complexity of Data Reviewed Labs: ordered. Decision-making details documented in ED Course. Radiology: ordered and independent interpretation  performed. Decision-making details documented in ED Course.  Risk Prescription drug management.    Patient presents with viral symptoms for 2 days. adequate oral hydration. Rest of history as above.  Patient appears well. No signs of toxicity, patient is interactive. No hypoxia, tachypnea or other signs of respiratory distress. No sign of clinical dehydration. Lung exam clear. Rest of exam as above.  We will assess for any evidence of bacterial infection.  CBC without leukocytosis or anemia. Metabolic panel with mild hyponatremia and hypochloremia.  No evidence of renal sufficiency.  No biliary obstruction. UA without evidence of infection. Lactic normal. COVID/influenza negative Chest x-ray negative for pneumonia.  Most consistent with viral illness vs post-vaccine fever (but feel this would be less likely)  No evidence suggestive of pharyngitis, AOM, PNA, or meningitis.   Discussed symptomatic treatment with the patient and they will follow closely with their PCP.         Final Clinical Impression(s) / ED Diagnoses Final diagnoses:  Fever in adult  Myalgia  Headache associated with infection   The patient appears reasonably screened and/or stabilized for discharge and I doubt any other medical condition or other [facility] requiring further screening, evaluation, or treatment in the ED at this time. I have discussed the findings, Dx and Tx plan with the patient/family who expressed understanding and agree(s) with the plan. Discharge instructions discussed at length. The patient/family was given strict return precautions who verbalized understanding of the instructions. No further questions at time of discharge.  Disposition: Discharge  Condition: Good  ED Discharge Orders     None        Follow Up: Rankins, Bill Salinas, MD [address] [id number] [phone]  Call  to schedule an appointment for close follow up           This chart was  dictated using voice recognition software.  Despite best efforts to proofread,  errors can occur which can change the documentation meaning.    Fatima Blank, MD 03/17/22 [phone]

## 2022-03-17 NOTE — Discharge Instructions (Signed)
You may take over-the-counter medicine for symptomatic relief, such as Tylenol, Motrin, TheraFlu, Alka seltzer , black elderberry, etc. Please limit acetaminophen (Tylenol) to 4000 mg and Ibuprofen (Motrin, Advil, etc.) to 2400 mg for a 24hr period. Please note that other over-the-counter medicine may contain acetaminophen or ibuprofen as a component of their ingredients.   

## 2022-03-25 ENCOUNTER — Encounter: Payer: Self-pay | Admitting: Internal Medicine

## 2022-03-27 ENCOUNTER — Encounter: Payer: Self-pay | Admitting: Certified Registered Nurse Anesthetist

## 2022-04-04 ENCOUNTER — Ambulatory Visit (AMBULATORY_SURGERY_CENTER): Payer: Medicare HMO | Admitting: Internal Medicine

## 2022-04-04 ENCOUNTER — Encounter: Payer: Self-pay | Admitting: Internal Medicine

## 2022-04-04 VITALS — BP 148/73 | HR 56 | Temp 97.5°F | Resp 12 | Ht 63.0 in | Wt 153.0 lb

## 2022-04-04 DIAGNOSIS — D124 Benign neoplasm of descending colon: Secondary | ICD-10-CM | POA: Diagnosis not present

## 2022-04-04 DIAGNOSIS — Z09 Encounter for follow-up examination after completed treatment for conditions other than malignant neoplasm: Secondary | ICD-10-CM

## 2022-04-04 DIAGNOSIS — D123 Benign neoplasm of transverse colon: Secondary | ICD-10-CM

## 2022-04-04 DIAGNOSIS — Z86010 Personal history of colonic polyps: Secondary | ICD-10-CM | POA: Diagnosis not present

## 2022-04-04 DIAGNOSIS — I10 Essential (primary) hypertension: Secondary | ICD-10-CM | POA: Diagnosis not present

## 2022-04-04 DIAGNOSIS — E785 Hyperlipidemia, unspecified: Secondary | ICD-10-CM | POA: Diagnosis not present

## 2022-04-04 MED ORDER — SODIUM CHLORIDE 0.9 % IV SOLN: 500.0000 mL | Freq: Once | INTRAVENOUS | Status: DC

## 2022-04-04 NOTE — Progress Notes (Signed)
Pt's states no medical or surgical changes since previsit or office visit. VS assessed by D.T 

## 2022-04-04 NOTE — Patient Instructions (Signed)
Handout on polyps and diverticulosis.  Await pathology results. Resume previous diet and continue present medications. Repeat colonoscopy for surveillance will be determined based off of pathology results.   YOU HAD AN ENDOSCOPIC PROCEDURE TODAY AT [facility]:   Refer to the procedure report that was given to you for any specific questions about what was found during the examination.  If the procedure report does not answer your questions, please call your gastroenterologist to clarify.  If you requested that your care partner not be given the details of your procedure findings, then the procedure report has been included in a sealed envelope for you to review at your convenience later.  YOU SHOULD EXPECT: Some feelings of bloating in the abdomen. Passage of more gas than usual.  Walking can help get rid of the air that was put into your GI tract during the procedure and reduce the bloating. If you had a lower endoscopy (such as a colonoscopy or flexible sigmoidoscopy) you may notice spotting of blood in your stool or on the toilet paper. If you underwent a bowel prep for your procedure, you may not have a normal bowel movement for a few days.  Please Note:  You might notice some irritation and congestion in your nose or some drainage.  This is from the oxygen used during your procedure.  There is no need for concern and it should clear up in a day or so.  SYMPTOMS TO REPORT IMMEDIATELY:  Following lower endoscopy (colonoscopy or flexible sigmoidoscopy):  Excessive amounts of blood in the stool  Significant tenderness or worsening of abdominal pains  Swelling of the abdomen that is new, acute  Fever of 100F or higher  For urgent or emergent issues, a gastroenterologist can be reached at any hour by calling [phone]. Do not use MyChart messaging for urgent concerns.    DIET:  We do recommend a small meal at first, but then you may proceed to your regular diet.  Drink  plenty of fluids but you should avoid alcoholic beverages for 24 hours.  ACTIVITY:  You should plan to take it easy for the rest of today and you should NOT DRIVE or use heavy machinery until tomorrow (because of the sedation medicines used during the test).    FOLLOW UP: Our staff will call the number listed on your records the next business day following your procedure.  We will call around 7:15- 8:00 am to check on you and address any questions or concerns that you may have regarding the information given to you following your procedure. If we do not reach you, we will leave a message.     If any biopsies were taken you will be contacted by phone or by letter within the next 1-3 weeks.  Please call us at [phone] if you have not heard about the biopsies in 3 weeks.    SIGNATURES/CONFIDENTIALITY: You and/or your care partner have signed paperwork which will be entered into your electronic medical record.  These signatures attest to the fact that that the information above on your After Visit Summary has been reviewed and is understood.  Full responsibility of the confidentiality of this discharge information lies with you and/or your care-partner.

## 2022-04-04 NOTE — Progress Notes (Signed)
GASTROENTEROLOGY PROCEDURE H&P NOTE   Primary Care Physician: Aretta Nip, MD    Reason for Procedure:   Hx of colon polyps  Plan:    colonoscopy  Patient is appropriate for endoscopic procedure(s) in the ambulatory ([place]) setting.  The nature of the procedure, as well as the risks, benefits, and alternatives were carefully and thoroughly reviewed with the patient. Ample time for discussion and questions allowed. The patient understood, was satisfied, and agreed to proceed.     HPI: Karen Rangel is a 71 y.o. female who presents for surveillance colonoscopy.  Medical history as below.  Tolerated the prep.  No recent chest pain or shortness of breath.  No abdominal pain today.  Past Medical History:  Diagnosis Date   Allergy    Anemia    Arthritis    Hyperlipidemia    Hypertension    Osteopenia     Past Surgical History:  Procedure Laterality Date   CESAREAN SECTION     x2   COLONOSCOPY     ENDOMETRIAL ABLATION     POLYPECTOMY     thumb surgery Bilateral    release   TONSILLECTOMY      Prior to Admission medications   Medication Sig Start Date End Date Taking? Authorizing Provider  Calcium Carbonate-Vitamin D (CALCIUM-D) 600-400 MG-UNIT TABS Take 1 tablet by mouth every morning.   Yes [provider]  Cholecalciferol (VITAMIN D3) 2000 UNITS TABS Take 1 tablet by mouth daily.   Yes [provider]  Cyanocobalamin (B-12) 100 MCG TABS Take by mouth.   Yes [provider]  fluticasone (FLONASE) 50 MCG/ACT nasal spray Place 2 sprays into both nostrils daily. 02/22/22  Yes [provider]  Glucosamine-Chondroit-Vit C-Mn (GLUCOSAMINE 1500 COMPLEX PO) Take 1 tablet by mouth every morning.   Yes [provider]  hydrochlorothiazide (HYDRODIURIL) 25 MG tablet Take 25 mg by mouth daily.   Yes [provider]  Multiple Vitamin (MULTIVITAMIN WITH MINERALS) TABS tablet Take 1 tablet by mouth daily.   Yes [provider]    Current Outpatient Medications  Medication Sig Dispense Refill   Calcium Carbonate-Vitamin D (CALCIUM-D) 600-400 MG-UNIT TABS Take 1 tablet by mouth every morning.     Cholecalciferol (VITAMIN D3) 2000 UNITS TABS Take 1 tablet by mouth daily.     Cyanocobalamin (B-12) 100 MCG TABS Take by mouth.     fluticasone (FLONASE) 50 MCG/ACT nasal spray Place 2 sprays into both nostrils daily.     Glucosamine-Chondroit-Vit C-Mn (GLUCOSAMINE 1500 COMPLEX PO) Take 1 tablet by mouth every morning.     hydrochlorothiazide (HYDRODIURIL) 25 MG tablet Take 25 mg by mouth daily.     Multiple Vitamin (MULTIVITAMIN WITH MINERALS) TABS tablet Take 1 tablet by mouth daily.     Current Facility-Administered Medications  Medication Dose Route Frequency Provider Last Rate Last Admin   0.9 %  sodium chloride infusion  500 mL Intravenous Once Ragan Duhon, Lajuan Lines, MD        Allergies as of 04/04/2022 - Review Complete 04/04/2022  Allergen Reaction Noted   Norvasc [amlodipine] Rash 03/21/2019    Family History  Problem Relation Age of Onset   Colon polyps Sister    Colon cancer Neg Hx    Esophageal cancer Neg Hx    Rectal cancer Neg Hx    Stomach cancer Neg Hx     Social History   Socioeconomic History   Marital status: Single    Spouse name: Not on  file   Number of children: Not on file   Years of education: Not on file   Highest education level: Not on file  Occupational History   Not on file  Tobacco Use   Smoking status: Never   Smokeless tobacco: Never  Substance and Sexual Activity   Alcohol use: No   Drug use: No   Sexual activity: Not on file  Other Topics Concern   Not on file  Social History Narrative   Not on file   Social Determinants of Health   Financial Resource Strain: Not on file  Food Insecurity: Not on file  Transportation Needs: Not on file  Physical Activity: Not on file  Stress: Not on file  Social Connections: Not on file  Intimate Partner Violence:  Not on file    Physical Exam: Vital signs in last 24 hours: '@BP'$  138/81   Pulse 64   Temp (!) 97.5 F (36.4 C) (Skin)   Ht '5\' 3"'$  (1.6 m)   Wt 153 lb (69.4 kg)   SpO2 98%   BMI 27.10 kg/m  GEN: NAD EYE: Sclerae anicteric ENT: MMM CV: Non-tachycardic Pulm: CTA b/l GI: Soft, NT/ND NEURO:  Alert & Oriented x 3   Karen Jarred, MD [facility]  04/04/2022 10:25 AM

## 2022-04-04 NOTE — Progress Notes (Signed)
Report given to PACU, vss 

## 2022-04-04 NOTE — Op Note (Signed)
[facility] Patient Name: Karen Rangel Procedure Date: 04/04/2022 10:29 AM MRN: [id number] Endoscopist: Jerene Bears , MD Age: 71 Referring MD:  Date of Birth: 1950/12/22 Gender: Female Account #: [id number] Procedure:                Colonoscopy Indications:              High risk colon cancer surveillance: Personal                            history of multiple adenomas/SSPs, Last                            colonoscopy: October 2020 (5 polyps), Feb 2015 (1                            polyp) Medicines:                Monitored Anesthesia Care Procedure:                Pre-Anesthesia Assessment:                           - Prior to the procedure, a History and Physical                            was performed, and patient medications and                            allergies were reviewed. The patient's tolerance of                            previous anesthesia was also reviewed. The risks                            and benefits of the procedure and the sedation                            options and risks were discussed with the patient.                            All questions were answered, and informed consent                            was obtained. Prior Anticoagulants: The patient has                            taken no previous anticoagulant or antiplatelet                            agents. ASA Grade Assessment: II - A patient with                            mild systemic disease. After reviewing the risks  and benefits, the patient was deemed in                            satisfactory condition to undergo the procedure.                           After obtaining informed consent, the colonoscope                            was passed under direct vision. Throughout the                            procedure, the patient's blood pressure, pulse, and                            oxygen saturations were monitored continuously. The                             Olympus CF-HQ190L ([id number]) Colonoscope was                            introduced through the anus and advanced to the                            cecum, identified by appendiceal orifice and                            ileocecal valve. The colonoscopy was performed                            without difficulty. The patient tolerated the                            procedure well. The quality of the bowel                            preparation was good. The ileocecal valve,                            appendiceal orifice, and rectum were photographed. Scope In: 10:36:53 AM Scope Out: 10:56:54 AM Scope Withdrawal Time: 0 hours 14 minutes 21 seconds  Total Procedure Duration: 0 hours 20 minutes 1 second  Findings:                 The digital rectal exam was normal.                           Three sessile polyps were found in the transverse                            colon. The polyps were 3 to 5 mm in size. These                            polyps were removed with a cold snare. Resection  and retrieval were complete.                           A 2 mm polyp was found in the transverse colon. The                            polyp was sessile. The polyp was removed with a                            cold biopsy forceps. Resection and retrieval were                            complete.                           A 5 mm polyp was found in the descending colon. The                            polyp was sessile. The polyp was removed with a                            cold snare. Resection and retrieval were complete.                           Multiple small and large-mouthed diverticula were                            found in the splenic flexure, transverse colon and                            hepatic flexure.                           The retroflexed view of the distal rectum and anal                            verge was normal and showed no anal or rectal                             abnormalities. Complications:            No immediate complications. Estimated Blood Loss:     Estimated blood loss was minimal. Impression:               - Three 3 to 5 mm polyps in the transverse colon,                            removed with a cold snare. Resected and retrieved.                           - One 2 mm polyp in the transverse colon, removed                            with a cold biopsy forceps. Resected and  retrieved.                           - One 5 mm polyp in the descending colon, removed                            with a cold snare. Resected and retrieved.                           - Mild diverticulosis at the splenic flexure, in                            the transverse colon and at the hepatic flexure.                           - The distal rectum and anal verge are normal on                            retroflexion view. Recommendation:           - Patient has a contact number available for                            emergencies. The signs and symptoms of potential                            delayed complications were discussed with the                            patient. Return to normal activities tomorrow.                            Written discharge instructions were provided to the                            patient.                           - Resume previous diet.                           - Continue present medications.                           - Await pathology results.                           - Repeat colonoscopy is recommended for                            surveillance. The colonoscopy date will be                            determined after pathology results from today's  exam become available for review. Jerene Bears, MD 04/04/2022 11:03:55 AM This report has been signed electronically.

## 2022-04-05 ENCOUNTER — Telehealth: Payer: Self-pay | Admitting: *Deleted

## 2022-04-05 NOTE — Telephone Encounter (Signed)
Post procedure follow up call placed, no answer and left VM.  

## 2022-04-07 ENCOUNTER — Encounter: Payer: Self-pay | Admitting: Internal Medicine

## 2022-11-01 IMAGING — MG MM DIGITAL SCREENING BILAT W/ TOMO AND CAD
8 series · 8 of 24 positions shown · non-contrast
Comparison: Previous exam(s).

CLINICAL DATA: Screening.

EXAM:
DIGITAL SCREENING BILATERAL MAMMOGRAM WITH TOMOSYNTHESIS AND CAD
TECHNIQUE: Bilateral screening digital craniocaudal and mediolateral oblique
mammograms were obtained. Bilateral screening digital breast
tomosynthesis was performed. The images were evaluated with
computer-aided detection.

[L CC synth-2D]
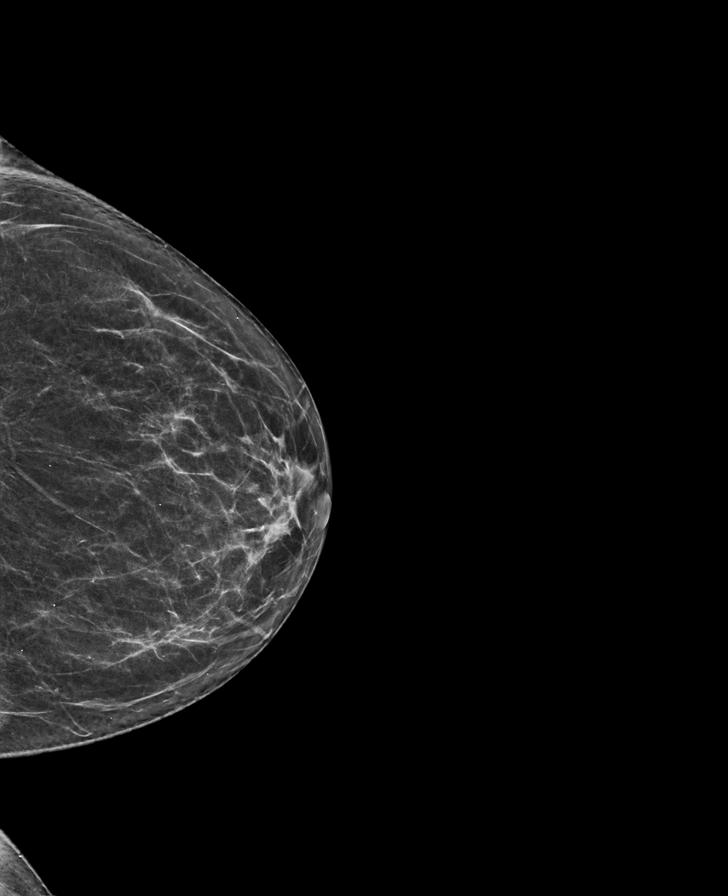

[R MLO synth-2D]
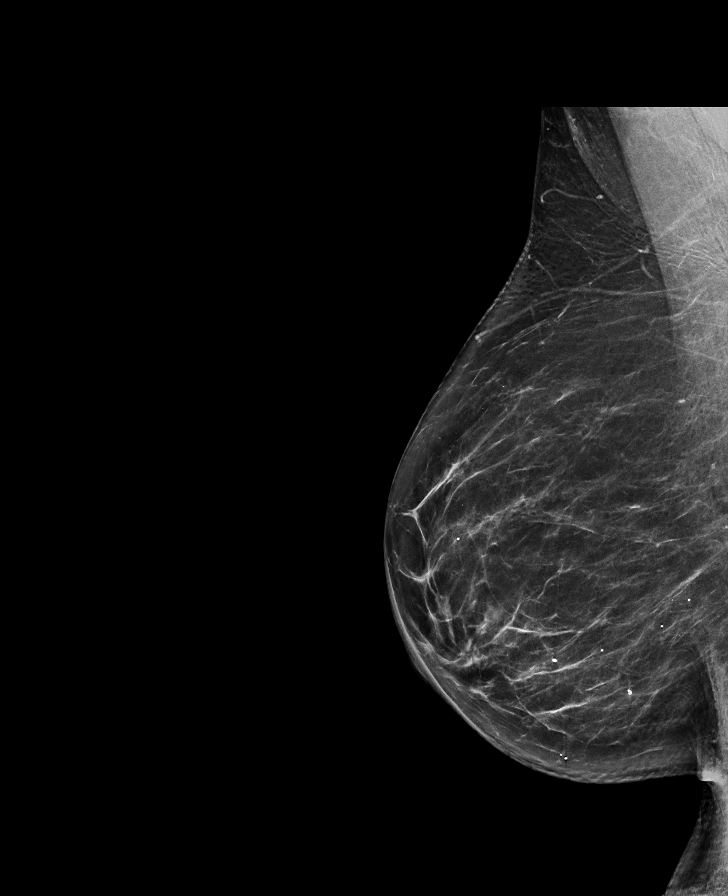

[L MLO synth-2D]
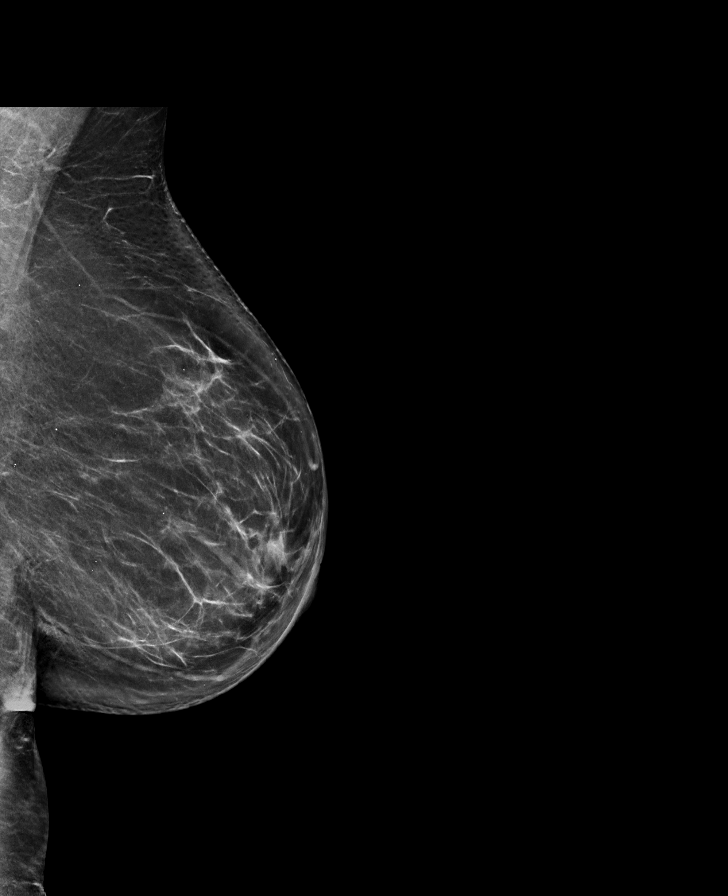

[R CC synth-2D]
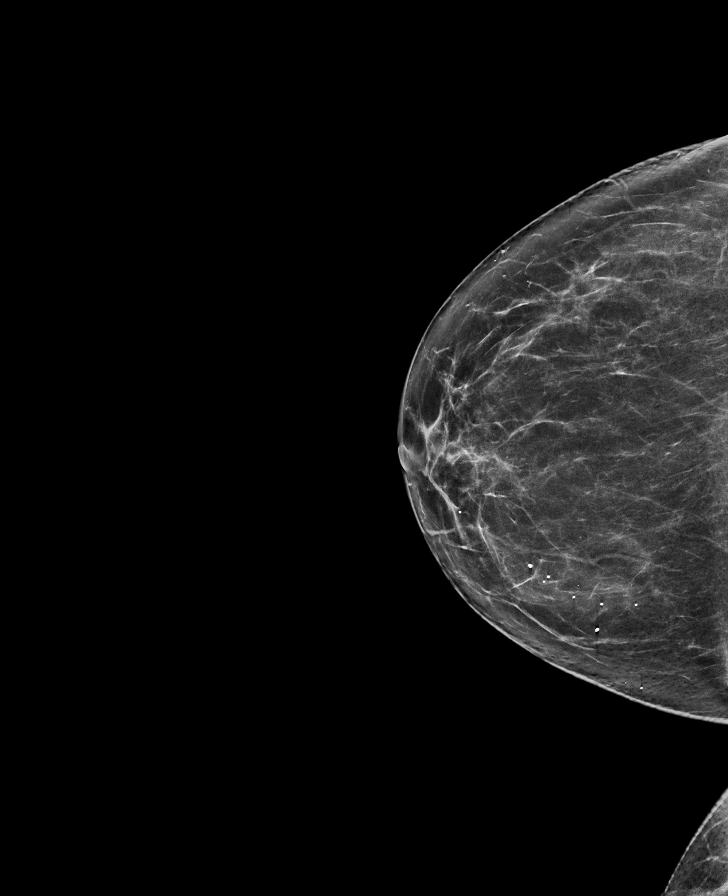

[L CC tomo · tomo slice 32/63.0]
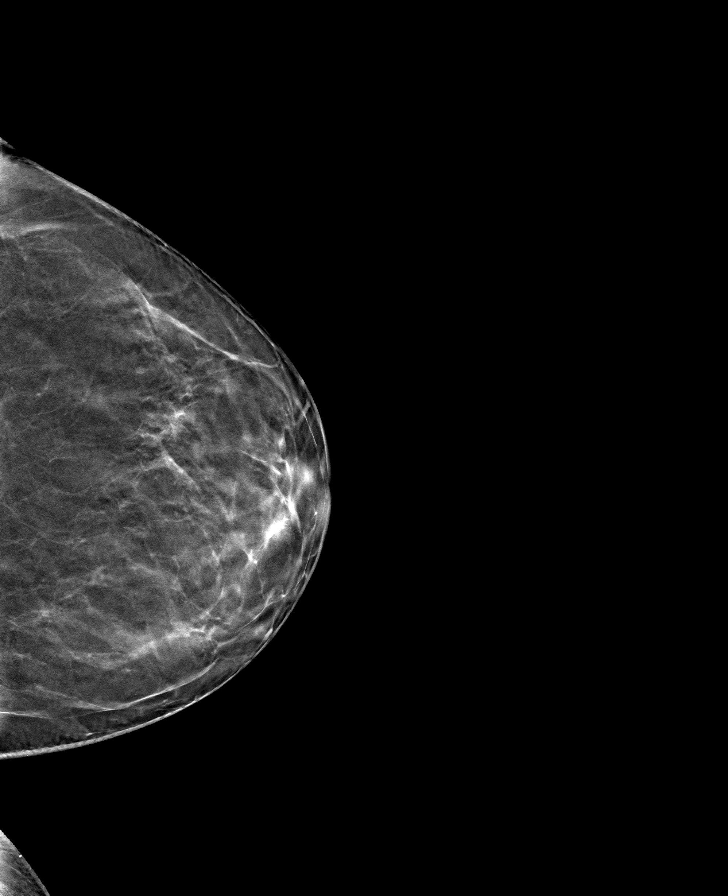

[L MLO tomo · tomo slice 37/74.0]
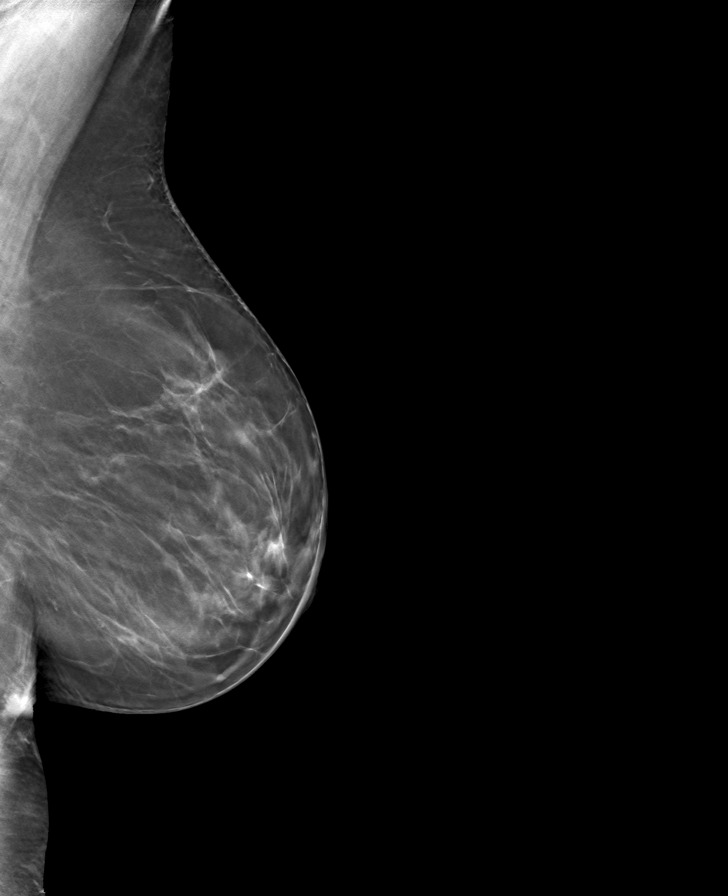

[R MLO tomo · tomo slice 37/73.0]
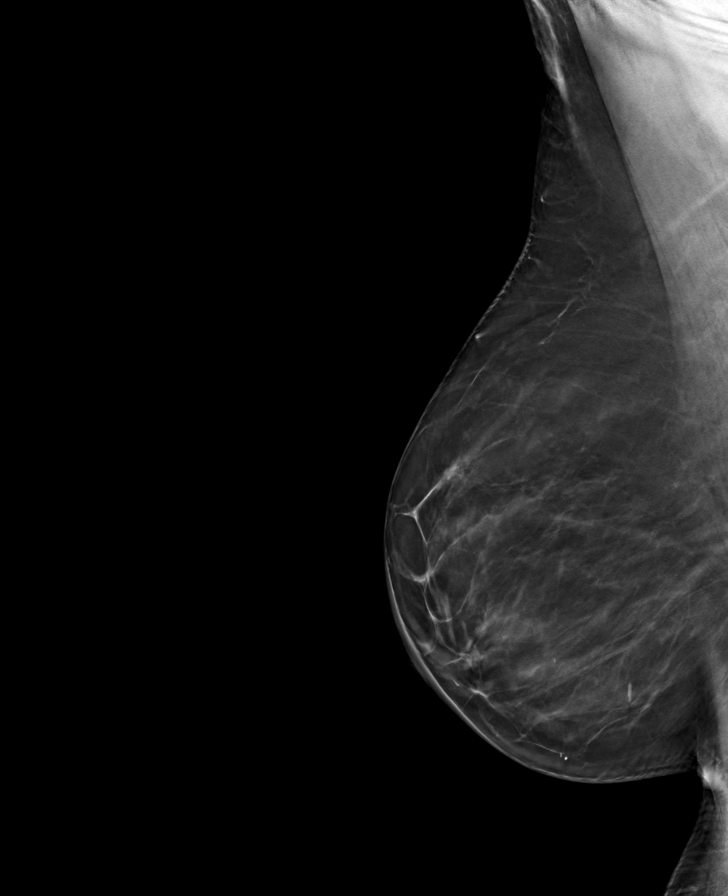

[R CC tomo · tomo slice 35/70.0]
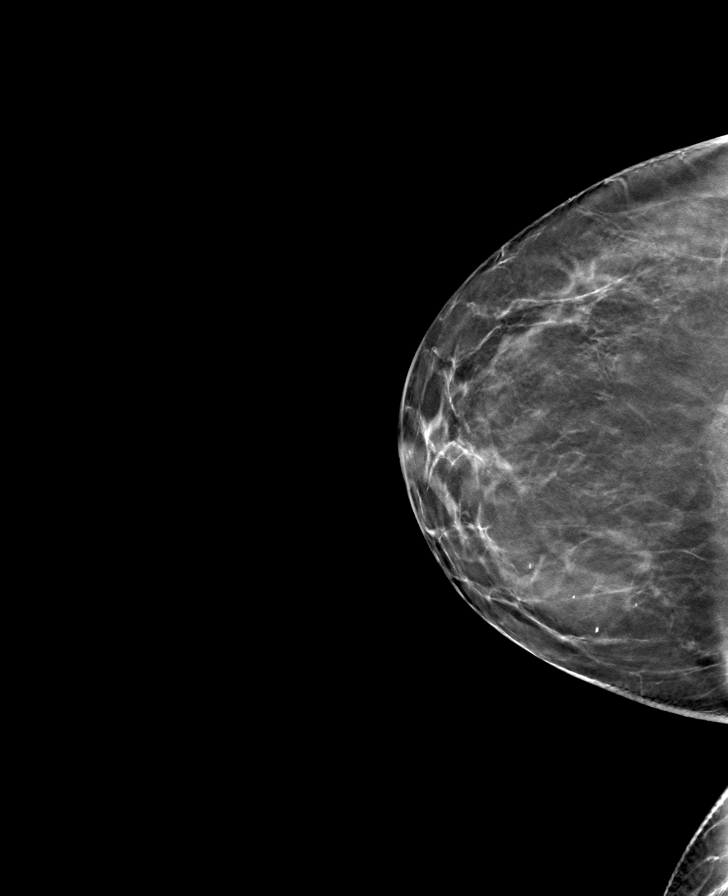

[8 of 24 positions shown; findings below may reference images not displayed]

ACR Breast Density Category b: There are scattered areas of
fibroglandular density.
FINDINGS: There are no findings suspicious for malignancy.
IMPRESSION: No mammographic evidence of malignancy. A result letter of this
screening mammogram will be mailed directly to the patient.

RECOMMENDATION:
Screening mammogram in one year. (Code:51-O-LD2)

BI-RADS CATEGORY  1: Negative.

## 2022-12-26 DIAGNOSIS — Z0000 Encounter for general adult medical examination without abnormal findings: Secondary | ICD-10-CM | POA: Diagnosis not present

## 2022-12-26 DIAGNOSIS — E663 Overweight: Secondary | ICD-10-CM | POA: Diagnosis not present

## 2022-12-26 DIAGNOSIS — E7800 Pure hypercholesterolemia, unspecified: Secondary | ICD-10-CM | POA: Diagnosis not present

## 2022-12-26 DIAGNOSIS — R7303 Prediabetes: Secondary | ICD-10-CM | POA: Diagnosis not present

## 2022-12-26 DIAGNOSIS — M8580 Other specified disorders of bone density and structure, unspecified site: Secondary | ICD-10-CM | POA: Diagnosis not present

## 2022-12-26 DIAGNOSIS — Z6827 Body mass index (BMI) 27.0-27.9, adult: Secondary | ICD-10-CM | POA: Diagnosis not present

## 2022-12-26 DIAGNOSIS — I10 Essential (primary) hypertension: Secondary | ICD-10-CM | POA: Diagnosis not present

## 2022-12-26 DIAGNOSIS — Z1389 Encounter for screening for other disorder: Secondary | ICD-10-CM | POA: Diagnosis not present

## 2022-12-26 DIAGNOSIS — L600 Ingrowing nail: Secondary | ICD-10-CM | POA: Diagnosis not present

## 2022-12-28 DIAGNOSIS — E7800 Pure hypercholesterolemia, unspecified: Secondary | ICD-10-CM | POA: Diagnosis not present

## 2022-12-28 DIAGNOSIS — Z79899 Other long term (current) drug therapy: Secondary | ICD-10-CM | POA: Diagnosis not present

## 2022-12-28 DIAGNOSIS — R7303 Prediabetes: Secondary | ICD-10-CM | POA: Diagnosis not present

## 2022-12-28 DIAGNOSIS — I10 Essential (primary) hypertension: Secondary | ICD-10-CM | POA: Diagnosis not present

## 2023-01-12 ENCOUNTER — Encounter: Payer: Self-pay | Admitting: Podiatry

## 2023-01-12 ENCOUNTER — Ambulatory Visit: Payer: Medicare HMO | Admitting: Podiatry

## 2023-01-12 DIAGNOSIS — L603 Nail dystrophy: Secondary | ICD-10-CM | POA: Diagnosis not present

## 2023-01-12 DIAGNOSIS — B351 Tinea unguium: Secondary | ICD-10-CM | POA: Diagnosis not present

## 2023-01-14 NOTE — Progress Notes (Signed)
  Subjective:  Patient ID: Karen Rangel, female    DOB: [birth year],  MRN: [id number] HPI Chief Complaint  Patient presents with   Nail Problem    [name] worse over the past yr, has done OTC treatment that did help but wants to make sure she's doing the right thing.     72 y.o. female presents with the above complaint.   ROS: Denies fever chills nausea vomit muscle aches pains calf pain back pain chest pain shortness of breath.  Past Medical History:  Diagnosis Date   Allergy    Anemia    Arthritis    Hyperlipidemia    Hypertension    Osteopenia    Past Surgical History:  Procedure Laterality Date   CESAREAN SECTION     x2   COLONOSCOPY     ENDOMETRIAL ABLATION     POLYPECTOMY     thumb surgery Bilateral    release   TONSILLECTOMY      Current Outpatient Medications:    Calcium Carbonate-Vitamin D (CALCIUM-D) 600-400 MG-UNIT TABS, Take 1 tablet by mouth every morning., Disp: , Rfl:    Cholecalciferol (VITAMIN D3) 2000 UNITS TABS, Take 1 tablet by mouth daily., Disp: , Rfl:    Cyanocobalamin (B-12) 100 MCG TABS, Take by mouth., Disp: , Rfl:    fluticasone (FLONASE) 50 MCG/ACT nasal spray, Place 2 sprays into both nostrils daily., Disp: , Rfl:    Glucosamine-Chondroit-Vit C-Mn (GLUCOSAMINE 1500 COMPLEX PO), Take 1 tablet by mouth every morning., Disp: , Rfl:    hydrochlorothiazide (HYDRODIURIL) 25 MG tablet, Take 25 mg by mouth daily., Disp: , Rfl:    Multiple Vitamin (MULTIVITAMIN WITH MINERALS) TABS tablet, Take 1 tablet by mouth daily., Disp: , Rfl:   Allergies  Allergen Reactions   Norvasc [Amlodipine] Rash   Review of Systems Objective:  There were no vitals filed for this visit.  General: Well developed, nourished, in no acute distress, alert and oriented x3   Dermatological: Skin is warm, dry and supple bilateral. Nails x 10 are well maintained; remaining integument appears unremarkable at this time. There are no open sores, no preulcerative lesions, no  rash or signs of infection present.  Hallux nail right however does demonstrate a thickened dystrophic discolored incurvated nail and nail margin  Vascular: Dorsalis Pedis artery and Posterior Tibial artery pedal pulses are 2/4 bilateral with immedate capillary fill time. Pedal hair growth present. No varicosities and no lower extremity edema present bilateral.   Neruologic: Grossly intact via light touch bilateral. Vibratory intact via tuning fork bilateral. Protective threshold with Semmes Wienstein monofilament intact to all pedal sites bilateral. Patellar and Achilles deep tendon reflexes 2+ bilateral. No Babinski or clonus noted bilateral.   Musculoskeletal: No gross boney pedal deformities bilateral. No pain, crepitus, or limitation noted with foot and ankle range of motion bilateral. Muscular strength 5/5 in all groups tested bilateral.  Gait: Unassisted, Nonantalgic.    Radiographs:  None taken  Assessment & Plan:   Assessment: Possible nail dystrophy onychomycosis.  Plan: Samples of the skin and nail were taken today for pathologic evaluation.     Karen Rangel T. [place], [place]

## 2023-02-15 DIAGNOSIS — I10 Essential (primary) hypertension: Secondary | ICD-10-CM | POA: Diagnosis not present

## 2023-02-15 DIAGNOSIS — H269 Unspecified cataract: Secondary | ICD-10-CM | POA: Diagnosis not present

## 2023-02-15 DIAGNOSIS — M1990 Unspecified osteoarthritis, unspecified site: Secondary | ICD-10-CM | POA: Diagnosis not present

## 2023-02-15 DIAGNOSIS — Z833 Family history of diabetes mellitus: Secondary | ICD-10-CM | POA: Diagnosis not present

## 2023-02-15 DIAGNOSIS — R7303 Prediabetes: Secondary | ICD-10-CM | POA: Diagnosis not present

## 2023-02-15 DIAGNOSIS — Z8249 Family history of ischemic heart disease and other diseases of the circulatory system: Secondary | ICD-10-CM | POA: Diagnosis not present

## 2023-02-15 DIAGNOSIS — J302 Other seasonal allergic rhinitis: Secondary | ICD-10-CM | POA: Diagnosis not present

## 2023-02-15 DIAGNOSIS — M8580 Other specified disorders of bone density and structure, unspecified site: Secondary | ICD-10-CM | POA: Diagnosis not present

## 2023-02-15 DIAGNOSIS — Z79899 Other long term (current) drug therapy: Secondary | ICD-10-CM | POA: Diagnosis not present

## 2023-02-15 DIAGNOSIS — Z008 Encounter for other general examination: Secondary | ICD-10-CM | POA: Diagnosis not present

## 2023-02-15 DIAGNOSIS — Z809 Family history of malignant neoplasm, unspecified: Secondary | ICD-10-CM | POA: Diagnosis not present

## 2023-02-16 ENCOUNTER — Encounter: Payer: Self-pay | Admitting: Podiatry

## 2023-02-16 ENCOUNTER — Ambulatory Visit: Payer: Medicare HMO | Admitting: Podiatry

## 2023-02-16 DIAGNOSIS — L603 Nail dystrophy: Secondary | ICD-10-CM | POA: Diagnosis not present

## 2023-02-16 NOTE — Progress Notes (Signed)
She presents today for follow-up of her nail pathology report.  States that the nails are unchanged.  Objective: Vital signs are stable alert oriented x 3 no change in physical exam pathology report does demonstrate a saprophytic fungus and nail dystrophy.  Assessment: Onychomycosis with nail dystrophy.  Plan: Discussed the pros and cons of laser therapy and oral therapy.  At this point she is going to do nothing other than painful toenails and she will follow-up with me with any worsening.

## 2023-06-30 DIAGNOSIS — I10 Essential (primary) hypertension: Secondary | ICD-10-CM | POA: Diagnosis not present

## 2023-06-30 DIAGNOSIS — Z6826 Body mass index (BMI) 26.0-26.9, adult: Secondary | ICD-10-CM | POA: Diagnosis not present

## 2023-06-30 DIAGNOSIS — J302 Other seasonal allergic rhinitis: Secondary | ICD-10-CM | POA: Diagnosis not present

## 2023-06-30 DIAGNOSIS — E7800 Pure hypercholesterolemia, unspecified: Secondary | ICD-10-CM | POA: Diagnosis not present

## 2023-06-30 DIAGNOSIS — T466X5A Adverse effect of antihyperlipidemic and antiarteriosclerotic drugs, initial encounter: Secondary | ICD-10-CM | POA: Diagnosis not present

## 2023-06-30 DIAGNOSIS — G8929 Other chronic pain: Secondary | ICD-10-CM | POA: Diagnosis not present

## 2023-06-30 DIAGNOSIS — G720 Drug-induced myopathy: Secondary | ICD-10-CM | POA: Diagnosis not present

## 2023-06-30 DIAGNOSIS — M25552 Pain in left hip: Secondary | ICD-10-CM | POA: Diagnosis not present

## 2023-08-08 DIAGNOSIS — H52223 Regular astigmatism, bilateral: Secondary | ICD-10-CM | POA: Diagnosis not present

## 2023-08-14 DIAGNOSIS — H25813 Combined forms of age-related cataract, bilateral: Secondary | ICD-10-CM | POA: Diagnosis not present

## 2023-09-11 DIAGNOSIS — Z01818 Encounter for other preprocedural examination: Secondary | ICD-10-CM | POA: Diagnosis not present

## 2023-09-11 DIAGNOSIS — H25812 Combined forms of age-related cataract, left eye: Secondary | ICD-10-CM | POA: Diagnosis not present

## 2023-09-11 DIAGNOSIS — H52223 Regular astigmatism, bilateral: Secondary | ICD-10-CM | POA: Diagnosis not present

## 2023-09-14 DIAGNOSIS — H25812 Combined forms of age-related cataract, left eye: Secondary | ICD-10-CM | POA: Diagnosis not present

## 2023-09-14 DIAGNOSIS — I10 Essential (primary) hypertension: Secondary | ICD-10-CM | POA: Diagnosis not present

## 2023-09-14 DIAGNOSIS — F419 Anxiety disorder, unspecified: Secondary | ICD-10-CM | POA: Diagnosis not present

## 2023-10-05 DIAGNOSIS — H25811 Combined forms of age-related cataract, right eye: Secondary | ICD-10-CM | POA: Diagnosis not present

## 2023-10-05 DIAGNOSIS — I10 Essential (primary) hypertension: Secondary | ICD-10-CM | POA: Diagnosis not present

## 2023-12-27 DIAGNOSIS — M8580 Other specified disorders of bone density and structure, unspecified site: Secondary | ICD-10-CM | POA: Diagnosis not present

## 2023-12-27 DIAGNOSIS — R7303 Prediabetes: Secondary | ICD-10-CM | POA: Diagnosis not present

## 2023-12-27 DIAGNOSIS — E7800 Pure hypercholesterolemia, unspecified: Secondary | ICD-10-CM | POA: Diagnosis not present

## 2023-12-27 DIAGNOSIS — Z6827 Body mass index (BMI) 27.0-27.9, adult: Secondary | ICD-10-CM | POA: Diagnosis not present

## 2023-12-27 DIAGNOSIS — Z23 Encounter for immunization: Secondary | ICD-10-CM | POA: Diagnosis not present

## 2023-12-27 DIAGNOSIS — I10 Essential (primary) hypertension: Secondary | ICD-10-CM | POA: Diagnosis not present

## 2023-12-27 DIAGNOSIS — Z0000 Encounter for general adult medical examination without abnormal findings: Secondary | ICD-10-CM | POA: Diagnosis not present

## 2024-02-15 DIAGNOSIS — Z780 Asymptomatic menopausal state: Secondary | ICD-10-CM | POA: Diagnosis not present

## 2024-02-15 DIAGNOSIS — Z1231 Encounter for screening mammogram for malignant neoplasm of breast: Secondary | ICD-10-CM | POA: Diagnosis not present

## 2024-02-15 DIAGNOSIS — M8589 Other specified disorders of bone density and structure, multiple sites: Secondary | ICD-10-CM | POA: Diagnosis not present
